# Patient Record
Sex: Female | Born: 1990 | Race: Black or African American | Hispanic: No | Marital: Married | Smoking: Never smoker
Health system: Southern US, Community
[De-identification: ages and names within clinical notes are randomized; demographics above are authoritative.]

## PROBLEM LIST (undated history)

## (undated) ENCOUNTER — Inpatient Hospital Stay (HOSPITAL_COMMUNITY): Payer: Self-pay

## (undated) DIAGNOSIS — Z789 Other specified health status: Secondary | ICD-10-CM

## (undated) HISTORY — DX: Other specified health status: Z78.9

## (undated) HISTORY — PX: NO PAST SURGERIES: SHX2092

---

## 2013-11-02 NOTE — L&D Delivery Note (Signed)
Delivery Note At 6:55 PM a viable female was delivered via Vaginal, Spontaneous Delivery (Presentation: ; Occiput Anterior).  APGAR: 8, 9; weight pending .   Placenta status: Intact, Spontaneous Pathology.  Cord: 3 vessels with the following complications: None.     Anesthesia: Epidural  Episiotomy: None Lacerations: 3rd degree Suture Repair: 3.0 vicryl Est. Blood Loss (mL): 400  Mom to postpartum.  Baby to Couplet care / Skin to Skin.  [name],[name] 10/23/2014, 7:32 PM  Dr. Adrian BlackwaterStinson for repair of 3rd degree laceration/

## 2014-08-27 ENCOUNTER — Encounter: Payer: Self-pay | Admitting: Family

## 2014-08-27 ENCOUNTER — Ambulatory Visit (INDEPENDENT_AMBULATORY_CARE_PROVIDER_SITE_OTHER): Payer: Self-pay | Admitting: Family

## 2014-08-27 VITALS — BP 102/64 | HR 80 | Temp 98.3°F | Ht 62.0 in | Wt 122.2 lb

## 2014-08-27 DIAGNOSIS — Z23 Encounter for immunization: Secondary | ICD-10-CM

## 2014-08-27 DIAGNOSIS — O0933 Supervision of pregnancy with insufficient antenatal care, third trimester: Secondary | ICD-10-CM | POA: Insufficient documentation

## 2014-08-27 DIAGNOSIS — Z3493 Encounter for supervision of normal pregnancy, unspecified, third trimester: Secondary | ICD-10-CM

## 2014-08-27 LAB — POCT URINALYSIS DIP (DEVICE)
BILIRUBIN URINE: NEGATIVE
GLUCOSE, UA: NEGATIVE mg/dL
Hgb urine dipstick: NEGATIVE
Ketones, ur: NEGATIVE mg/dL
Leukocytes, UA: NEGATIVE
Nitrite: NEGATIVE
Protein, ur: NEGATIVE mg/dL
SPECIFIC GRAVITY, URINE: 1.02 (ref 1.005–1.030)
Urobilinogen, UA: 0.2 mg/dL (ref 0.0–1.0)
pH: 7 (ref 5.0–8.0)

## 2014-08-27 NOTE — Progress Notes (Signed)
   Subjective:    Carla Haley is a G1P0 6651w6d being seen today for her first obstetrical visit.  [place]  Denies any problems during the pregnancy.  Her obstetrical history is significant for late prenatal care in [place] and recent immigration. Patient does intend to breast feed. Pregnancy history fully reviewed.  Patient reports no complaints.  Reports coughing and diarrhea two weeks ago, but no symptoms at this time.    Filed Vitals:   08/27/14 1407 08/27/14 1410  BP: 102/64   Pulse: 80   Temp: 98.3 F (36.8 C)   Height:  5\' 2"  (1.575 m)  Weight: 122 lb 3.2 oz (55.43 kg)     HISTORY: OB History  Gravida Para Term Preterm AB SAB TAB Ectopic Multiple Living  1             # Outcome Date GA Lbr Len/2nd Weight Sex Delivery Anes PTL Lv  1 CUR              Past Medical History  Diagnosis Date  . Medical history non-contributory    Past Surgical History  Procedure Laterality Date  . No past surgeries     History reviewed. No pertinent family history.   Exam   BP 102/64  Pulse 80  Temp(Src) 98.3 F (36.8 C)  Ht 5\' 2"  (1.575 m)  Wt 122 lb 3.2 oz (55.43 kg)  BMI 22.35 kg/m2  LMP 01/23/2014 Uterine Size: size equals dates  Pelvic Exam:    Perineum: No Hemorrhoids, Normal Perineum   Vulva: normal   Vagina:  normal mucosa, normal discharge, no palpable nodules   pH: Not done   Cervix: no bleeding following Pap, no cervical motion tenderness and no lesions   Adnexa: normal adnexa and no mass, fullness, tenderness   Bony Pelvis: Adequate  System: Breast:  No nipple retraction or dimpling, No nipple discharge or bleeding, No axillary or supraclavicular adenopathy, Normal to palpation without dominant masses   Skin: normal coloration and turgor, no rashes    Neurologic: negative   Extremities: normal strength, tone, and muscle mass   HEENT neck supple with midline trachea and thyroid without masses   Mouth/Teeth mucous membranes  moist, pharynx normal without lesions   Neck supple and no masses   Cardiovascular: regular rate and rhythm, no murmurs or gallops   Respiratory:  appears well, vitals normal, no respiratory distress, acyanotic, normal RR, neck free of mass or lymphadenopathy, chest clear, no wheezing, crepitations, rhonchi, normal symmetric air entry   Abdomen: soft, non-tender; bowel sounds normal; no masses,  no organomegaly   Urinary: urethral meatus normal     Assessment:    Pregnancy:   23 yo G1P0 at 2751w6d wks   Patient Active Problem List   Diagnosis Date Noted  . Late prenatal care affecting pregnancy in third trimester 08/27/2014        Plan:     Initial labs drawn.  Pap smear collected; GC/CT.  1 hr GCT.   Prenatal vitamins. Problem list reviewed and updated.  Ultrasound discussed; fetal survey: ordered.  Follow up in 2 weeks.  Marlis EdelsonKARIM, Stat[place] Markey N 08/27/2014

## 2014-08-27 NOTE — Progress Notes (Signed)
Initial prenatal Flu vaccine

## 2014-08-28 ENCOUNTER — Encounter: Payer: Self-pay | Admitting: Family

## 2014-08-28 LAB — OBSTETRIC PANEL
Antibody Screen: NEGATIVE
Basophils Absolute: 0 10*3/uL (ref 0.0–0.1)
Basophils Relative: 0 % (ref 0–1)
EOS ABS: 0.2 10*3/uL (ref 0.0–0.7)
Eosinophils Relative: 2 % (ref 0–5)
HCT: 33.8 % — ABNORMAL LOW (ref 36.0–46.0)
HEP B S AG: NEGATIVE
Hemoglobin: 11.3 g/dL — ABNORMAL LOW (ref 12.0–15.0)
LYMPHS PCT: 20 % (ref 12–46)
Lymphs Abs: 1.5 10*3/uL (ref 0.7–4.0)
MCH: 30.6 pg (ref 26.0–34.0)
MCHC: 33.4 g/dL (ref 30.0–36.0)
MCV: 91.6 fL (ref 78.0–100.0)
Monocytes Absolute: 0.6 10*3/uL (ref 0.1–1.0)
Monocytes Relative: 8 % (ref 3–12)
NEUTROS PCT: 70 % (ref 43–77)
Neutro Abs: 5.3 10*3/uL (ref 1.7–7.7)
PLATELETS: 231 10*3/uL (ref 150–400)
RBC: 3.69 MIL/uL — AB (ref 3.87–5.11)
RDW: 12.7 % (ref 11.5–15.5)
RUBELLA: 8.36 {index} — AB (ref <0.90)
Rh Type: POSITIVE
WBC: 7.6 10*3/uL (ref 4.0–10.5)

## 2014-08-28 LAB — CYTOLOGY - PAP

## 2014-08-28 LAB — HIV ANTIBODY (ROUTINE TESTING W REFLEX): HIV: NONREACTIVE

## 2014-08-28 LAB — GLUCOSE TOLERANCE, 1 HOUR (50G) W/O FASTING: Glucose, 1 Hour GTT: 98 mg/dL (ref 70–140)

## 2014-08-29 ENCOUNTER — Other Ambulatory Visit: Payer: Self-pay | Admitting: Family

## 2014-08-29 ENCOUNTER — Ambulatory Visit (HOSPITAL_COMMUNITY)
Admission: RE | Admit: 2014-08-29 | Discharge: 2014-08-29 | Disposition: A | Discharge status: Home / Self Care | Payer: Medicaid Other | Source: Ambulatory Visit | Attending: Family | Admitting: Family

## 2014-08-29 DIAGNOSIS — O0933 Supervision of pregnancy with insufficient antenatal care, third trimester: Secondary | ICD-10-CM | POA: Diagnosis not present

## 2014-08-29 DIAGNOSIS — Z3493 Encounter for supervision of normal pregnancy, unspecified, third trimester: Secondary | ICD-10-CM

## 2014-08-29 DIAGNOSIS — O403XX0 Polyhydramnios, third trimester, not applicable or unspecified: Secondary | ICD-10-CM | POA: Diagnosis not present

## 2014-08-29 DIAGNOSIS — Z3A31 31 weeks gestation of pregnancy: Secondary | ICD-10-CM | POA: Insufficient documentation

## 2014-08-29 DIAGNOSIS — Z36 Encounter for antenatal screening of mother: Secondary | ICD-10-CM | POA: Insufficient documentation

## 2014-08-29 DIAGNOSIS — Z3689 Encounter for other specified antenatal screening: Secondary | ICD-10-CM

## 2014-08-29 LAB — HEMOGLOBINOPATHY EVALUATION
HGB A: 96.7 % — AB (ref 96.8–97.8)
HGB S QUANTITAION: 0 %
Hemoglobin Other: 0 %
Hgb A2 Quant: 3 % (ref 2.2–3.2)
Hgb F Quant: 0.3 % (ref 0.0–2.0)

## 2014-08-29 LAB — PRESCRIPTION MONITORING PROFILE (19 PANEL)
AMPHETAMINE/METH: NEGATIVE ng/mL
BARBITURATE SCREEN, URINE: NEGATIVE ng/mL
BENZODIAZEPINE SCREEN, URINE: NEGATIVE ng/mL
Buprenorphine, Urine: NEGATIVE ng/mL
COCAINE METABOLITES: NEGATIVE ng/mL
Cannabinoid Scrn, Ur: NEGATIVE ng/mL
Carisoprodol, Urine: NEGATIVE ng/mL
Creatinine, Urine: 105.43 mg/dL (ref >20.0)
Fentanyl, Ur: NEGATIVE ng/mL
MDMA URINE: NEGATIVE ng/mL
Meperidine, Ur: NEGATIVE ng/mL
Methadone Screen, Urine: NEGATIVE ng/mL
Methaqualone: NEGATIVE ng/mL
Nitrites, Initial: NEGATIVE ug/mL
OPIATE SCREEN, URINE: NEGATIVE ng/mL
Oxycodone Screen, Ur: NEGATIVE ng/mL
PH URINE, INITIAL: 7.1 pH (ref 4.5–8.9)
Phencyclidine, Ur: NEGATIVE ng/mL
Propoxyphene: NEGATIVE ng/mL
TAPENTADOLUR: NEGATIVE ng/mL
TRAMADOL UR: NEGATIVE ng/mL
ZOLPIDEM, URINE: NEGATIVE ng/mL

## 2014-08-29 LAB — CULTURE, OB URINE: Colony Count: 30000

## 2014-08-31 ENCOUNTER — Other Ambulatory Visit: Payer: Self-pay | Admitting: Family

## 2014-08-31 ENCOUNTER — Encounter: Payer: Self-pay | Admitting: Family

## 2014-08-31 DIAGNOSIS — O403XX0 Polyhydramnios, third trimester, not applicable or unspecified: Secondary | ICD-10-CM | POA: Insufficient documentation

## 2014-08-31 DIAGNOSIS — O403XX1 Polyhydramnios, third trimester, fetus 1: Secondary | ICD-10-CM

## 2014-09-04 ENCOUNTER — Encounter: Payer: Self-pay | Admitting: Family

## 2014-09-05 ENCOUNTER — Telehealth: Payer: Self-pay | Admitting: *Deleted

## 2014-09-05 DIAGNOSIS — R9389 Abnormal findings on diagnostic imaging of other specified body structures: Secondary | ICD-10-CM

## 2014-09-05 NOTE — Telephone Encounter (Signed)
Appointment made for Genetic counseling on 09/06/14 @ 1400.  D. Day RNC to evaluate need of Weekly BPP/NST with provider.   Attempted to contact patient, no answer, left message with brother in law for patient to call clinic [phone]).

## 2014-09-05 NOTE — Telephone Encounter (Signed)
-----   Message from Arne ClevelandMandy J Hutchinson, [place] sent at 09/03/2014  1:03 PM EST ----- Regarding: FW: Genetic Counseling and weekly BPP This [facility] instead of the [facility] clinical pool.  Forwarding to correct pool ----- Message -----    From: Marlis EdelsonWalidah N Karim, CNM    Sent: 08/31/2014   8:42 AM      To: Marlis EdelsonWalidah N Karim, CNM, [facility] Clinical Pool Subject: Genetic Counseling and weekly BPP              Please schedule MFM consult for genetic counseling (left echogenic focus - recommended by radiologist).  Pt also needs to be scheduled for weekly BPP and NST for polyhydramnios starting next week.  I've called patient and left a message to call back.  ----- Message -----    From: Rad Results In Interface    Sent: 08/29/2014   4:53 PM      To: Marlis EdelsonWalidah N Karim, CNM

## 2014-09-05 NOTE — Telephone Encounter (Addendum)
Called pt again since she has not returned our call earlier. I left a message stating that I am calling with important information regarding appts which have been scheduled for 11/5. Please call back to nurse voice mail and state whether a detailed message can be left on her voice mail.  *Pt needs to be informed of appt @ [facility] @ 2pm, then clinic appt @ 3pm to follow.  11/5  1400  Received call from [facility] dept that pt has arrived there for appt.  They will send pt to clinic after appt.

## 2014-09-06 ENCOUNTER — Other Ambulatory Visit: Payer: Self-pay | Admitting: Obstetrics & Gynecology

## 2014-09-06 ENCOUNTER — Ambulatory Visit (HOSPITAL_COMMUNITY)
Admission: RE | Admit: 2014-09-06 | Discharge: 2014-09-06 | Disposition: A | Discharge status: Home / Self Care | Payer: Medicaid Other | Source: Ambulatory Visit | Attending: Obstetrics & Gynecology | Admitting: Obstetrics & Gynecology

## 2014-09-06 ENCOUNTER — Ambulatory Visit (INDEPENDENT_AMBULATORY_CARE_PROVIDER_SITE_OTHER): Payer: Self-pay | Admitting: Obstetrics & Gynecology

## 2014-09-06 VITALS — BP 100/62 | HR 83 | Temp 97.3°F | Wt 121.7 lb

## 2014-09-06 DIAGNOSIS — O219 Vomiting of pregnancy, unspecified: Secondary | ICD-10-CM

## 2014-09-06 DIAGNOSIS — O283 Abnormal ultrasonic finding on antenatal screening of mother: Secondary | ICD-10-CM

## 2014-09-06 DIAGNOSIS — O99613 Diseases of the digestive system complicating pregnancy, third trimester: Secondary | ICD-10-CM

## 2014-09-06 DIAGNOSIS — O401XX1 Polyhydramnios, first trimester, fetus 1: Secondary | ICD-10-CM

## 2014-09-06 DIAGNOSIS — O403XX1 Polyhydramnios, third trimester, fetus 1: Secondary | ICD-10-CM

## 2014-09-06 DIAGNOSIS — O0933 Supervision of pregnancy with insufficient antenatal care, third trimester: Secondary | ICD-10-CM

## 2014-09-06 DIAGNOSIS — K219 Gastro-esophageal reflux disease without esophagitis: Secondary | ICD-10-CM

## 2014-09-06 LAB — US OB FOLLOW UP

## 2014-09-06 LAB — POCT URINALYSIS DIP (DEVICE)
Bilirubin Urine: NEGATIVE
Glucose, UA: NEGATIVE mg/dL
HGB URINE DIPSTICK: NEGATIVE
Ketones, ur: NEGATIVE mg/dL
Leukocytes, UA: NEGATIVE
Nitrite: NEGATIVE
PH: 6.5 (ref 5.0–8.0)
Protein, ur: NEGATIVE mg/dL
SPECIFIC GRAVITY, URINE: 1.015 (ref 1.005–1.030)
UROBILINOGEN UA: 0.2 mg/dL (ref 0.0–1.0)

## 2014-09-06 MED ORDER — PROMETHAZINE HCL 25 MG PO TABS: 25.0000 mg | ORAL_TABLET | Freq: Four times a day (QID) | ORAL | Status: DC | PRN

## 2014-09-06 MED ORDER — FAMOTIDINE 20 MG PO TABS: 20.0000 mg | ORAL_TABLET | Freq: Two times a day (BID) | ORAL | Status: DC

## 2014-09-06 NOTE — Addendum Note (Signed)
Addended by: Jill SideAY, DIANE L on: 09/06/2014 04:59 PM   Modules accepted: Level of Service

## 2014-09-06 NOTE — Patient Instructions (Signed)
Return to clinic for any obstetric concerns or go to [facility] for evaluation  

## 2014-09-06 NOTE — Progress Notes (Signed)
Vomiting likely due to GERD in third trimester; prescribed Pepcid and Phernergan. Will continue monitoring for polyhydramnios.   NST performed today was reviewed and was found to be reactive.  AFI normal at 23 cm.  Continue recommended antenatal testing and prenatal care. No other complaints or concerns.  Labor and fetal movement precautions reviewed.

## 2014-09-06 NOTE — Progress Notes (Signed)
Patient reports still vomiting.  Pt had genetic counseling visit today.

## 2014-09-07 DIAGNOSIS — O283 Abnormal ultrasonic finding on antenatal screening of mother: Secondary | ICD-10-CM | POA: Insufficient documentation

## 2014-09-07 NOTE — Progress Notes (Signed)
Genetic Counseling  High-Risk Gestation Note  Appointment Date:  09/06/2014 Referred By: Lesly DukesLeggett, Kelly H, MD Date of Birth:  1991-01-10 Partner:  Jerene DillingPrince Mushunju   Pregnancy History: G1P0 Estimated Date of Delivery: 10/30/14 Estimated Gestational Age: 2546w2d Attending: Particia NearingMartha Decker, MD    Carla Haley and her partner, Mr. Jerene DillingMushunju [name], were seen for genetic counseling because of previous abnormal ultrasound findings.    Ms. Alvie HeidelbergLaurette Dobransky [facility] 08/[facility]/14.  Ultrasound revealed an echogenic intracardiac focus (EIF) and polyhydramnios.  The ultrasound report is under separate cover.    We discussed that the second trimester genetic sonogram is targeted at identifying features associated with aneuploidy.  It has evolved as a screening tool used to provide an individualized risk assessment for Down syndrome and other trisomies.  The ability of sonography to aid in the detection of aneuploidies relies on identification of both major structural anomalies and "soft markers."  The patient was counseled that the latter term refers to findings that are often normal variants and do not cause any significant medical problems.  Nonetheless, these markers have a known association with aneuploidy.    The patient was counseled that an EIF is characterized by calcified papillary muscle leading to a discreet dot in the left, or less commonly, the right ventricle.  We discussed that this finding is typically considered to be a benign variant; however, the risk of aneuploidy is increased when this marker is found in patients who have additional risk factors for fetal aneuploidy (AMA, abnormal screening test, or other markers or anomalies by fetal ultrasound).  We reviewed that Ms. Shian Plante's risk to have a fetus with Down syndrome is ~1 in 241070, based on her age of 23 y.o. at 832 weeks gestation.  Considering the combined ultrasound findings  of an EIF and pyelectasis, the adjusted risk for fetal Down syndrome is ~1 in * (*%).  Polyhydramnios is defined as an excess amount of amniotic fluid for the gestational age. We discussed that polyhydramnios is observed in 1% of pregnancies, and is especially common in pregnant women who have diabetes mellitus (DM) and in obese women. Polyhydramnios can also be due to a fetal anomaly, most commonly an obstructive abnormality such as duodenal atresia or TE fistula/esophageal atresia. Polyhydramnios can also be the result of an underlying genetic etiology. Specifically, we discussed the associated increase in risk for chromosome aberrations and single gene causes.   We reviewed chromosomes, nondisjunction, and the common features and variable prognosis of Down syndrome.  We also reviewed other aneuploidies including trisomies 13 and 4618; however, we discussed that these conditions are less likely given that the remainder of the fetal anatomy was wnl by ultrasound.  We reviewed other available screening and diagnostic options including noninvasive prenatal screening (NIPS)/cell free DNA (cfDNA) testing and amniocentesis.  She was counseled regarding the benefits and limitations of each option.  We reviewed the approximate 1 in 300-500 risk for complications for amniocentesis, including spontaneous preterm labor and delivery. After consideration of all the options, they declined additional screening or testing for fetal aneuploidy in the pregnancy, including NIPS and amniocentesis. The couple stated that they were comfortable waiting until delivery for additional testing for the baby regarding chromosome or genetic conditions, if such testing is warranted at that time.   Ms. Alvie HeidelbergLaurette Erhart was provided with written information regarding sickle cell anemia (SCA) including the carrier frequency and incidence in the African population, the availability of carrier  testing and prenatal diagnosis if indicated.  In  addition, we discussed that hemoglobinopathies are routinely screened for as part of the [place] newborn screening panel.  She previously had hemoglobin electrophoresis performed through her OB, which was within normal range.  Both family histories were reviewed and found to be noncontributory for birth defects, intellectual disabilities, and known genetic conditions. Without further information regarding the provided family history, an accurate genetic risk cannot be calculated. Further genetic counseling is warranted if more information is obtained.  Ms. Alvie HeidelbergLaurette Rautio denied exposure to environmental toxins or chemical agents. She denied the use of alcohol, tobacco or street drugs. She denied significant viral illnesses during the course of her pregnancy. Her medical and surgical histories were noncontributory.   I counseled this couple regarding the above risks and available options.  The approximate face-to-face time with the genetic counselor was 35 minutes.  Quinn PlowmanKaren Mae Cianci, MS Certified Genetic Counselor 09/07/2014

## 2014-09-10 ENCOUNTER — Ambulatory Visit (INDEPENDENT_AMBULATORY_CARE_PROVIDER_SITE_OTHER): Payer: Medicaid Other | Admitting: *Deleted

## 2014-09-10 VITALS — BP 97/62 | HR 86

## 2014-09-10 DIAGNOSIS — R300 Dysuria: Secondary | ICD-10-CM

## 2014-09-10 DIAGNOSIS — O403XX1 Polyhydramnios, third trimester, fetus 1: Secondary | ICD-10-CM

## 2014-09-10 DIAGNOSIS — O26893 Other specified pregnancy related conditions, third trimester: Secondary | ICD-10-CM

## 2014-09-10 LAB — POCT URINALYSIS DIP (DEVICE)
BILIRUBIN URINE: NEGATIVE
Glucose, UA: NEGATIVE mg/dL
HGB URINE DIPSTICK: NEGATIVE
Ketones, ur: NEGATIVE mg/dL
LEUKOCYTES UA: NEGATIVE
NITRITE: NEGATIVE
PH: 7 (ref 5.0–8.0)
PROTEIN: NEGATIVE mg/dL
Specific Gravity, Urine: 1.02 (ref 1.005–1.030)
Urobilinogen, UA: 0.2 mg/dL (ref 0.0–1.0)

## 2014-09-10 NOTE — Progress Notes (Signed)
Category 1 tracing.  Moderate variability, multiple accelerations, no decelerations.

## 2014-09-10 NOTE — Progress Notes (Signed)
Pt reports pelvic pain and painful urination.  Pelvic pain is relieved with rest.  UA done - negative for infection. Pt also reports frequent diarrhea- 4 or more times each night. Immodium OTC recommended - take sparingly to avoid constipation.

## 2014-09-11 ENCOUNTER — Encounter: Payer: Self-pay | Admitting: Obstetrics & Gynecology

## 2014-09-13 ENCOUNTER — Ambulatory Visit (INDEPENDENT_AMBULATORY_CARE_PROVIDER_SITE_OTHER): Payer: Medicaid Other | Admitting: Family Medicine

## 2014-09-13 VITALS — BP 112/70 | HR 83 | Temp 97.9°F | Wt 123.2 lb

## 2014-09-13 DIAGNOSIS — Z23 Encounter for immunization: Secondary | ICD-10-CM

## 2014-09-13 DIAGNOSIS — O403XX0 Polyhydramnios, third trimester, not applicable or unspecified: Secondary | ICD-10-CM

## 2014-09-13 DIAGNOSIS — O403XX1 Polyhydramnios, third trimester, fetus 1: Secondary | ICD-10-CM

## 2014-09-13 LAB — US OB FOLLOW UP

## 2014-09-13 LAB — POCT URINALYSIS DIP (DEVICE)
Bilirubin Urine: NEGATIVE
Glucose, UA: NEGATIVE mg/dL
Hgb urine dipstick: NEGATIVE
KETONES UR: NEGATIVE mg/dL
Leukocytes, UA: NEGATIVE
Nitrite: NEGATIVE
PH: 7 (ref 5.0–8.0)
PROTEIN: NEGATIVE mg/dL
Specific Gravity, Urine: 1.02 (ref 1.005–1.030)
UROBILINOGEN UA: 0.2 mg/dL (ref 0.0–1.0)

## 2014-09-13 MED ORDER — TETANUS-DIPHTH-ACELL PERTUSSIS 5-2.5-18.5 LF-MCG/0.5 IM SUSP
0.5000 mL | Freq: Once | INTRAMUSCULAR | Status: AC
  Administered 2014-09-13: 0.5 mL via INTRAMUSCULAR

## 2014-09-13 NOTE — Patient Instructions (Signed)
Third Trimester of Pregnancy The third trimester is from week 29 through week 42, months 7 through 9. The third trimester is a time when the fetus is growing rapidly. At the end of the ninth month, the fetus is about 20 inches in length and weighs 6-10 pounds.  BODY CHANGES Your body goes through many changes during pregnancy. The changes vary from woman to woman.   Your weight will continue to increase. You can expect to gain 25-35 pounds (11-16 kg) by the end of the pregnancy.  You may begin to get stretch marks on your hips, abdomen, and breasts.  You may urinate more often because the fetus is moving lower into your pelvis and pressing on your bladder.  You may develop or continue to have heartburn as a result of your pregnancy.  You may develop constipation because certain hormones are causing the muscles that push waste through your intestines to slow down.  You may develop hemorrhoids or swollen, bulging veins (varicose veins).  You may have pelvic pain because of the weight gain and pregnancy hormones relaxing your joints between the bones in your pelvis. Backaches may result from overexertion of the muscles supporting your posture.  You may have changes in your hair. These can include thickening of your hair, rapid growth, and changes in texture. Some women also have hair loss during or after pregnancy, or hair that feels dry or thin. Your hair will most likely return to normal after your baby is born.  Your breasts will continue to grow and be tender. A yellow discharge may leak from your breasts called colostrum.  Your belly button may stick out.  You may feel short of breath because of your expanding uterus.  You may notice the fetus "dropping," or moving lower in your abdomen.  You may have a bloody mucus discharge. This usually occurs a few days to a week before labor begins.  Your cervix becomes thin and soft (effaced) near your due date. WHAT TO EXPECT AT YOUR PRENATAL  EXAMS  You will have prenatal exams every 2 weeks until week 36. Then, you will have weekly prenatal exams. During a routine prenatal visit:  You will be weighed to make sure you and the fetus are growing normally.  Your blood pressure is taken.  Your abdomen will be measured to track your baby's growth.  The fetal heartbeat will be listened to.  Any test results from the previous visit will be discussed.  You may have a cervical check near your due date to see if you have effaced. At around 36 weeks, your caregiver will check your cervix. At the same time, your caregiver will also perform a test on the secretions of the vaginal tissue. This test is to determine if a type of bacteria, Group B streptococcus, is present. Your caregiver will explain this further. Your caregiver may ask you:  What your birth plan is.  How you are feeling.  If you are feeling the baby move.  If you have had any abnormal symptoms, such as leaking fluid, bleeding, severe headaches, or abdominal cramping.  If you have any questions. Other tests or screenings that may be performed during your third trimester include:  Blood tests that check for low iron levels (anemia).  Fetal testing to check the health, activity level, and growth of the fetus. Testing is done if you have certain medical conditions or if there are problems during the pregnancy. FALSE LABOR You may feel small, irregular contractions that   eventually go away. These are called Braxton Hicks contractions, or false labor. Contractions may last for hours, days, or even weeks before true labor sets in. If contractions come at regular intervals, intensify, or become painful, it is best to be seen by your caregiver.  SIGNS OF LABOR   Menstrual-like cramps.  Contractions that are 5 minutes apart or less.  Contractions that start on the top of the uterus and spread down to the lower abdomen and back.  A sense of increased pelvic pressure or back  pain.  A watery or bloody mucus discharge that comes from the vagina. If you have any of these signs before the 37th week of pregnancy, call your caregiver right away. You need to go to the hospital to get checked immediately. HOME CARE INSTRUCTIONS   Avoid all smoking, herbs, alcohol, and unprescribed drugs. These chemicals affect the formation and growth of the baby.  Follow your caregiver's instructions regarding medicine use. There are medicines that are either safe or unsafe to take during pregnancy.  Exercise only as directed by your caregiver. Experiencing uterine cramps is a good sign to stop exercising.  Continue to eat regular, healthy meals.  Wear a good support bra for breast tenderness.  Do not use hot tubs, steam rooms, or saunas.  Wear your seat belt at all times when driving.  Avoid raw meat, uncooked cheese, cat litter boxes, and soil used by cats. These carry germs that can cause birth defects in the baby.  Take your prenatal vitamins.  Try taking a stool softener (if your caregiver approves) if you develop constipation. Eat more high-fiber foods, such as fresh vegetables or fruit and whole grains. Drink plenty of fluids to keep your urine clear or pale yellow.  Take warm sitz baths to soothe any pain or discomfort caused by hemorrhoids. Use hemorrhoid cream if your caregiver approves.  If you develop varicose veins, wear support hose. Elevate your feet for 15 minutes, 3-4 times a day. Limit salt in your diet.  Avoid heavy lifting, wear low heal shoes, and practice good posture.  Rest a lot with your legs elevated if you have leg cramps or low back pain.  Visit your dentist if you have not gone during your pregnancy. Use a soft toothbrush to brush your teeth and be gentle when you floss.  A sexual relationship may be continued unless your caregiver directs you otherwise.  Do not travel far distances unless it is absolutely necessary and only with the approval  of your caregiver.  Take prenatal classes to understand, practice, and ask questions about the labor and delivery.  Make a trial run to the hospital.  Pack your hospital bag.  Prepare the baby's nursery.  Continue to go to all your prenatal visits as directed by your caregiver. SEEK MEDICAL CARE IF:  You are unsure if you are in labor or if your water has broken.  You have dizziness.  You have mild pelvic cramps, pelvic pressure, or nagging pain in your abdominal area.  You have persistent nausea, vomiting, or diarrhea.  You have a bad smelling vaginal discharge.  You have pain with urination. SEEK IMMEDIATE MEDICAL CARE IF:   You have a fever.  You are leaking fluid from your vagina.  You have spotting or bleeding from your vagina.  You have severe abdominal cramping or pain.  You have rapid weight loss or gain.  You have shortness of breath with chest pain.  You notice sudden or extreme swelling   of your face, hands, ankles, feet, or legs.  You have not felt your baby move in over an hour.  You have severe headaches that do not go away with medicine.  You have vision changes. Document Released: 10/13/2001 Document Revised: 10/24/2013 Document Reviewed: 12/20/2012 ExitCare Patient Information 2015 [employer]. This information is not intended to replace advice given to you by your health care provider. Make sure you discuss any questions you have with your health care provider.  

## 2014-09-13 NOTE — Progress Notes (Signed)
No concerns.  Intermittent contractions.  Good fetal movement.  No bleeding, leaking fluid.   Category 1 tracing with baseline in 130-140s.  Moderate variability, multiple accelerations, no decelerations.  Intermittent ctxs noted.

## 2014-09-17 ENCOUNTER — Ambulatory Visit (INDEPENDENT_AMBULATORY_CARE_PROVIDER_SITE_OTHER): Payer: Medicaid Other | Admitting: *Deleted

## 2014-09-17 VITALS — BP 108/62 | HR 82

## 2014-09-17 DIAGNOSIS — O99613 Diseases of the digestive system complicating pregnancy, third trimester: Secondary | ICD-10-CM

## 2014-09-17 DIAGNOSIS — O403XX1 Polyhydramnios, third trimester, fetus 1: Secondary | ICD-10-CM

## 2014-09-17 DIAGNOSIS — K219 Gastro-esophageal reflux disease without esophagitis: Secondary | ICD-10-CM

## 2014-09-17 DIAGNOSIS — O403XX0 Polyhydramnios, third trimester, not applicable or unspecified: Secondary | ICD-10-CM

## 2014-09-17 DIAGNOSIS — O219 Vomiting of pregnancy, unspecified: Secondary | ICD-10-CM

## 2014-09-17 MED ORDER — PROMETHAZINE HCL 25 MG PO TABS: 25.0000 mg | ORAL_TABLET | Freq: Four times a day (QID) | ORAL | Status: DC | PRN

## 2014-09-17 MED ORDER — FAMOTIDINE 20 MG PO TABS: 20.0000 mg | ORAL_TABLET | Freq: Two times a day (BID) | ORAL | Status: DC

## 2014-09-17 NOTE — Progress Notes (Signed)
Pt states she is vomiting after every meal and has "burning" in her throat all the time.  She has not yet obtained prescriptions for Pepcid and phenergan because she is waiting for her Medicaid card to arrive. I obtained pt's Medicaid ID # from [employer] website and wrote it down for her to take to the pharmacy so that she can obtain her prescriptions.

## 2014-09-20 ENCOUNTER — Ambulatory Visit (INDEPENDENT_AMBULATORY_CARE_PROVIDER_SITE_OTHER): Payer: Medicaid Other | Admitting: Family Medicine

## 2014-09-20 VITALS — BP 105/60 | HR 87 | Temp 97.8°F | Wt 126.4 lb

## 2014-09-20 DIAGNOSIS — O403XX1 Polyhydramnios, third trimester, fetus 1: Secondary | ICD-10-CM

## 2014-09-20 LAB — POCT URINALYSIS DIP (DEVICE)
Bilirubin Urine: NEGATIVE
GLUCOSE, UA: NEGATIVE mg/dL
Hgb urine dipstick: NEGATIVE
Ketones, ur: NEGATIVE mg/dL
Leukocytes, UA: NEGATIVE
NITRITE: NEGATIVE
PROTEIN: NEGATIVE mg/dL
SPECIFIC GRAVITY, URINE: 1.015 (ref 1.005–1.030)
UROBILINOGEN UA: 0.2 mg/dL (ref 0.0–1.0)
pH: 7.5 (ref 5.0–8.0)

## 2014-09-20 LAB — US OB FOLLOW UP

## 2014-09-20 NOTE — Progress Notes (Signed)
Doing well NST reviewed and reactive.

## 2014-09-20 NOTE — Patient Instructions (Signed)
Third Trimester of Pregnancy The third trimester is from week 29 through week 42, months 7 through 9. The third trimester is a time when the fetus is growing rapidly. At the end of the ninth month, the fetus is about 20 inches in length and weighs 6-10 pounds.  BODY CHANGES Your body goes through many changes during pregnancy. The changes vary from woman to woman.   Your weight will continue to increase. You can expect to gain 25-35 pounds (11-16 kg) by the end of the pregnancy.  You may begin to get stretch marks on your hips, abdomen, and breasts.  You may urinate more often because the fetus is moving lower into your pelvis and pressing on your bladder.  You may develop or continue to have heartburn as a result of your pregnancy.  You may develop constipation because certain hormones are causing the muscles that push waste through your intestines to slow down.  You may develop hemorrhoids or swollen, bulging veins (varicose veins).  You may have pelvic pain because of the weight gain and pregnancy hormones relaxing your joints between the bones in your pelvis. Backaches may result from overexertion of the muscles supporting your posture.  You may have changes in your hair. These can include thickening of your hair, rapid growth, and changes in texture. Some women also have hair loss during or after pregnancy, or hair that feels dry or thin. Your hair will most likely return to normal after your baby is born.  Your breasts will continue to grow and be tender. A yellow discharge may leak from your breasts called colostrum.  Your belly button may stick out.  You may feel short of breath because of your expanding uterus.  You may notice the fetus "dropping," or moving lower in your abdomen.  You may have a bloody mucus discharge. This usually occurs a few days to a week before labor begins.  Your cervix becomes thin and soft (effaced) near your due date. WHAT TO EXPECT AT YOUR  PRENATAL EXAMS  You will have prenatal exams every 2 weeks until week 36. Then, you will have weekly prenatal exams. During a routine prenatal visit:  You will be weighed to make sure you and the fetus are growing normally.  Your blood pressure is taken.  Your abdomen will be measured to track your baby's growth.  The fetal heartbeat will be listened to.  Any test results from the previous visit will be discussed.  You may have a cervical check near your due date to see if you have effaced. At around 36 weeks, your caregiver will check your cervix. At the same time, your caregiver will also perform a test on the secretions of the vaginal tissue. This test is to determine if a type of bacteria, Group B streptococcus, is present. Your caregiver will explain this further. Your caregiver may ask you:  What your birth plan is.  How you are feeling.  If you are feeling the baby move.  If you have had any abnormal symptoms, such as leaking fluid, bleeding, severe headaches, or abdominal cramping.  If you have any questions. Other tests or screenings that may be performed during your third trimester include:  Blood tests that check for low iron levels (anemia).  Fetal testing to check the health, activity level, and growth of the fetus. Testing is done if you have certain medical conditions or if there are problems during the pregnancy. FALSE LABOR You may feel small, irregular contractions that   eventually go away. These are called Braxton Hicks contractions, or false labor. Contractions may last for hours, days, or even weeks before true labor sets in. If contractions come at regular intervals, intensify, or become painful, it is best to be seen by your caregiver.  SIGNS OF LABOR   Menstrual-like cramps.  Contractions that are 5 minutes apart or less.  Contractions that start on the top of the uterus and spread down to the lower abdomen and back.  A sense of increased pelvic  pressure or back pain.  A watery or bloody mucus discharge that comes from the vagina. If you have any of these signs before the 37th week of pregnancy, call your caregiver right away. You need to go to the hospital to get checked immediately. HOME CARE INSTRUCTIONS   Avoid all smoking, herbs, alcohol, and unprescribed drugs. These chemicals affect the formation and growth of the baby.  Follow your caregiver's instructions regarding medicine use. There are medicines that are either safe or unsafe to take during pregnancy.  Exercise only as directed by your caregiver. Experiencing uterine cramps is a good sign to stop exercising.  Continue to eat regular, healthy meals.  Wear a good support bra for breast tenderness.  Do not use hot tubs, steam rooms, or saunas.  Wear your seat belt at all times when driving.  Avoid raw meat, uncooked cheese, cat litter boxes, and soil used by cats. These carry germs that can cause birth defects in the baby.  Take your prenatal vitamins.  Try taking a stool softener (if your caregiver approves) if you develop constipation. Eat more high-fiber foods, such as fresh vegetables or fruit and whole grains. Drink plenty of fluids to keep your urine clear or pale yellow.  Take warm sitz baths to soothe any pain or discomfort caused by hemorrhoids. Use hemorrhoid cream if your caregiver approves.  If you develop varicose veins, wear support hose. Elevate your feet for 15 minutes, 3-4 times a day. Limit salt in your diet.  Avoid heavy lifting, wear low heal shoes, and practice good posture.  Rest a lot with your legs elevated if you have leg cramps or low back pain.  Visit your dentist if you have not gone during your pregnancy. Use a soft toothbrush to brush your teeth and be gentle when you floss.  A sexual relationship may be continued unless your caregiver directs you otherwise.  Do not travel far distances unless it is absolutely necessary and only  with the approval of your caregiver.  Take prenatal classes to understand, practice, and ask questions about the labor and delivery.  Make a trial run to the hospital.  Pack your hospital bag.  Prepare the baby's nursery.  Continue to go to all your prenatal visits as directed by your caregiver. SEEK MEDICAL CARE IF:  You are unsure if you are in labor or if your water has broken.  You have dizziness.  You have mild pelvic cramps, pelvic pressure, or nagging pain in your abdominal area.  You have persistent nausea, vomiting, or diarrhea.  You have a bad smelling vaginal discharge.  You have pain with urination. SEEK IMMEDIATE MEDICAL CARE IF:   You have a fever.  You are leaking fluid from your vagina.  You have spotting or bleeding from your vagina.  You have severe abdominal cramping or pain.  You have rapid weight loss or gain.  You have shortness of breath with chest pain.  You notice sudden or extreme swelling   of your face, hands, ankles, feet, or legs.  You have not felt your baby move in over an hour.  You have severe headaches that do not go away with medicine.  You have vision changes. Document Released: 10/13/2001 Document Revised: 10/24/2013 Document Reviewed: 12/20/2012 [employer] Patient Information 2015 [employer]. This information is not intended to replace advice given to you by your health care provider. Make sure you discuss any questions you have with your health care provider.  Breastfeeding Deciding to breastfeed is one of the best choices you can make for you and your baby. A change in hormones during pregnancy causes your breast tissue to grow and increases the number and size of your milk ducts. These hormones also allow proteins, sugars, and fats from your blood supply to make breast milk in your milk-producing glands. Hormones prevent breast milk from being released before your baby is born as well as prompt milk flow after birth. Once  breastfeeding has begun, thoughts of your baby, as well as his or her sucking or crying, can stimulate the release of milk from your milk-producing glands.  BENEFITS OF BREASTFEEDING For Your Baby  Your first milk (colostrum) helps your baby's digestive system function better.   There are antibodies in your milk that help your baby fight off infections.   Your baby has a lower incidence of asthma, allergies, and sudden infant death syndrome.   The nutrients in breast milk are better for your baby than infant formulas and are designed uniquely for your baby's needs.   Breast milk improves your baby's brain development.   Your baby is less likely to develop other conditions, such as childhood obesity, asthma, or type 2 diabetes mellitus.  For You   Breastfeeding helps to create a very special bond between you and your baby.   Breastfeeding is convenient. Breast milk is always available at the correct temperature and costs nothing.   Breastfeeding helps to burn calories and helps you lose the weight gained during pregnancy.   Breastfeeding makes your uterus contract to its prepregnancy size faster and slows bleeding (lochia) after you give birth.   Breastfeeding helps to lower your risk of developing type 2 diabetes mellitus, osteoporosis, and breast or ovarian cancer later in life. SIGNS THAT YOUR BABY IS HUNGRY Early Signs of Hunger  Increased alertness or activity.  Stretching.  Movement of the head from side to side.  Movement of the head and opening of the mouth when the corner of the mouth or cheek is stroked (rooting).  Increased sucking sounds, smacking lips, cooing, sighing, or squeaking.  Hand-to-mouth movements.  Increased sucking of fingers or hands. Late Signs of Hunger  Fussing.  Intermittent crying. Extreme Signs of Hunger Signs of extreme hunger will require calming and consoling before your baby will be able to breastfeed successfully. Do not  wait for the following signs of extreme hunger to occur before you initiate breastfeeding:   Restlessness.  A loud, strong cry.   Screaming. BREASTFEEDING BASICS Breastfeeding Initiation  Find a comfortable place to sit or lie down, with your neck and back well supported.  Place a pillow or rolled up blanket under your baby to bring him or her to the level of your breast (if you are seated). Nursing pillows are specially designed to help support your arms and your baby while you breastfeed.  Make sure that your baby's abdomen is facing your abdomen.   Gently massage your breast. With your fingertips, massage from your chest   wall toward your nipple in a circular motion. This encourages milk flow. You may need to continue this action during the feeding if your milk flows slowly.  Support your breast with 4 fingers underneath and your thumb above your nipple. Make sure your fingers are well away from your nipple and your baby's mouth.   Stroke your baby's lips gently with your finger or nipple.   When your baby's mouth is open wide enough, quickly bring your baby to your breast, placing your entire nipple and as much of the colored area around your nipple (areola) as possible into your baby's mouth.   More areola should be visible above your baby's upper lip than below the lower lip.   Your baby's tongue should be between his or her lower gum and your breast.   Ensure that your baby's mouth is correctly positioned around your nipple (latched). Your baby's lips should create a seal on your breast and be turned out (everted).  It is common for your baby to suck about 2-3 minutes in order to start the flow of breast milk. Latching Teaching your baby how to latch on to your breast properly is very important. An improper latch can cause nipple pain and decreased milk supply for you and poor weight gain in your baby. Also, if your baby is not latched onto your nipple properly, he or she  may swallow some air during feeding. This can make your baby fussy. Burping your baby when you switch breasts during the feeding can help to get rid of the air. However, teaching your baby to latch on properly is still the best way to prevent fussiness from swallowing air while breastfeeding. Signs that your baby has successfully latched on to your nipple:    Silent tugging or silent sucking, without causing you pain.   Swallowing heard between every 3-4 sucks.    Muscle movement above and in front of his or her ears while sucking.  Signs that your baby has not successfully latched on to nipple:   Sucking sounds or smacking sounds from your baby while breastfeeding.  Nipple pain. If you think your baby has not latched on correctly, slip your finger into the corner of your baby's mouth to break the suction and place it between your baby's gums. Attempt breastfeeding initiation again. Signs of Successful Breastfeeding Signs from your baby:   A gradual decrease in the number of sucks or complete cessation of sucking.   Falling asleep.   Relaxation of his or her body.   Retention of a small amount of milk in his or her mouth.   Letting go of your breast by himself or herself. Signs from you:  Breasts that have increased in firmness, weight, and size 1-3 hours after feeding.   Breasts that are softer immediately after breastfeeding.  Increased milk volume, as well as a change in milk consistency and color by the fifth day of breastfeeding.   Nipples that are not sore, cracked, or bleeding. Signs That Your Baby is Getting Enough Milk  Wetting at least 3 diapers in a 24-hour period. The urine should be clear and pale yellow by age 5 days.  At least 3 stools in a 24-hour period by age 5 days. The stool should be soft and yellow.  At least 3 stools in a 24-hour period by age 7 days. The stool should be seedy and yellow.  No loss of weight greater than 10% of birth weight  during the first 3   days of age.  Average weight gain of 4-7 ounces (113-198 g) per week after age 4 days.  Consistent daily weight gain by age 5 days, without weight loss after the age of 2 weeks. After a feeding, your baby may spit up a small amount. This is common. BREASTFEEDING FREQUENCY AND DURATION Frequent feeding will help you make more milk and can prevent sore nipples and breast engorgement. Breastfeed when you feel the need to reduce the fullness of your breasts or when your baby shows signs of hunger. This is called "breastfeeding on demand." Avoid introducing a pacifier to your baby while you are working to establish breastfeeding (the first 4-6 weeks after your baby is born). After this time you may choose to use a pacifier. Research has shown that pacifier use during the first year of a baby's life decreases the risk of sudden infant death syndrome (SIDS). Allow your baby to feed on each breast as long as he or she wants. Breastfeed until your baby is finished feeding. When your baby unlatches or falls asleep while feeding from the first breast, offer the second breast. Because newborns are often sleepy in the first few weeks of life, you may need to awaken your baby to get him or her to feed. Breastfeeding times will vary from baby to baby. However, the following rules can serve as a guide to help you ensure that your baby is properly fed:  Newborns (babies 4 weeks of age or younger) may breastfeed every 1-3 hours.  Newborns should not go longer than 3 hours during the day or 5 hours during the night without breastfeeding.  You should breastfeed your baby a minimum of 8 times in a 24-hour period until you begin to introduce solid foods to your baby at around 6 months of age. BREAST MILK PUMPING Pumping and storing breast milk allows you to ensure that your baby is exclusively fed your breast milk, even at times when you are unable to breastfeed. This is especially important if you are  going back to work while you are still breastfeeding or when you are not able to be present during feedings. Your lactation consultant can give you guidelines on how long it is safe to store breast milk.  A breast pump is a machine that allows you to pump milk from your breast into a sterile bottle. The pumped breast milk can then be stored in a refrigerator or freezer. Some breast pumps are operated by hand, while others use electricity. Ask your lactation consultant which type will work best for you. Breast pumps can be purchased, but some hospitals and breastfeeding support groups lease breast pumps on a monthly basis. A lactation consultant can teach you how to hand express breast milk, if you prefer not to use a pump.  CARING FOR YOUR BREASTS WHILE YOU BREASTFEED Nipples can become dry, cracked, and sore while breastfeeding. The following recommendations can help keep your breasts moisturized and healthy:  Avoid using soap on your nipples.   Wear a supportive bra. Although not required, special nursing bras and tank tops are designed to allow access to your breasts for breastfeeding without taking off your entire bra or top. Avoid wearing underwire-style bras or extremely tight bras.  Air dry your nipples for 3-4minutes after each feeding.   Use only cotton bra pads to absorb leaked breast milk. Leaking of breast milk between feedings is normal.   Use lanolin on your nipples after breastfeeding. Lanolin helps to maintain your skin's   normal moisture barrier. If you use pure lanolin, you do not need to wash it off before feeding your baby again. Pure lanolin is not toxic to your baby. You may also hand express a few drops of breast milk and gently massage that milk into your nipples and allow the milk to air dry. In the first few weeks after giving birth, some women experience extremely full breasts (engorgement). Engorgement can make your breasts feel heavy, warm, and tender to the touch.  Engorgement peaks within 3-5 days after you give birth. The following recommendations can help ease engorgement:  Completely empty your breasts while breastfeeding or pumping. You may want to start by applying warm, moist heat (in the shower or with warm water-soaked hand towels) just before feeding or pumping. This increases circulation and helps the milk flow. If your baby does not completely empty your breasts while breastfeeding, pump any extra milk after he or she is finished.  Wear a snug bra (nursing or regular) or tank top for 1-2 days to signal your body to slightly decrease milk production.  Apply ice packs to your breasts, unless this is too uncomfortable for you.  Make sure that your baby is latched on and positioned properly while breastfeeding. If engorgement persists after 48 hours of following these recommendations, contact your health care provider or a lactation consultant. OVERALL HEALTH CARE RECOMMENDATIONS WHILE BREASTFEEDING  Eat healthy foods. Alternate between meals and snacks, eating 3 of each per day. Because what you eat affects your breast milk, some of the foods may make your baby more irritable than usual. Avoid eating these foods if you are sure that they are negatively affecting your baby.  Drink milk, fruit juice, and water to satisfy your thirst (about 10 glasses a day).   Rest often, relax, and continue to take your prenatal vitamins to prevent fatigue, stress, and anemia.  Continue breast self-awareness checks.  Avoid chewing and smoking tobacco.  Avoid alcohol and drug use. Some medicines that may be harmful to your baby can pass through breast milk. It is important to ask your health care provider before taking any medicine, including all over-the-counter and prescription medicine as well as vitamin and herbal supplements. It is possible to become pregnant while breastfeeding. If birth control is desired, ask your health care provider about options that  will be safe for your baby. SEEK MEDICAL CARE IF:   You feel like you want to stop breastfeeding or have become frustrated with breastfeeding.  You have painful breasts or nipples.  Your nipples are cracked or bleeding.  Your breasts are red, tender, or warm.  You have a swollen area on either breast.  You have a fever or chills.  You have nausea or vomiting.  You have drainage other than breast milk from your nipples.  Your breasts do not become full before feedings by the fifth day after you give birth.  You feel sad and depressed.  Your baby is too sleepy to eat well.  Your baby is having trouble sleeping.   Your baby is wetting less than 3 diapers in a 24-hour period.  Your baby has less than 3 stools in a 24-hour period.  Your baby's skin or the white part of his or her eyes becomes yellow.   Your baby is not gaining weight by 5 days of age. SEEK IMMEDIATE MEDICAL CARE IF:   Your baby is overly tired (lethargic) and does not want to wake up and feed.  Your baby   develops an unexplained fever. Document Released: 10/19/2005 Document Revised: 10/24/2013 Document Reviewed: 04/12/2013 [employer] Patient Information 2015 [employer]. This information is not intended to replace advice given to you by your health care provider. Make sure you discuss any questions you have with your health care provider.  

## 2014-09-24 ENCOUNTER — Inpatient Hospital Stay (HOSPITAL_COMMUNITY)
Admission: AD | Admit: 2014-09-24 | Discharge: 2014-09-24 | Disposition: A | Discharge status: Home / Self Care | Payer: Medicaid Other | Source: Ambulatory Visit | Attending: Family Medicine | Admitting: Family Medicine

## 2014-09-24 ENCOUNTER — Inpatient Hospital Stay (HOSPITAL_COMMUNITY): Payer: Medicaid Other

## 2014-09-24 ENCOUNTER — Ambulatory Visit (INDEPENDENT_AMBULATORY_CARE_PROVIDER_SITE_OTHER): Payer: Medicaid Other | Admitting: *Deleted

## 2014-09-24 VITALS — BP 106/67 | HR 88

## 2014-09-24 DIAGNOSIS — Z3A34 34 weeks gestation of pregnancy: Secondary | ICD-10-CM | POA: Diagnosis not present

## 2014-09-24 DIAGNOSIS — O403XX1 Polyhydramnios, third trimester, fetus 1: Secondary | ICD-10-CM

## 2014-09-24 DIAGNOSIS — O403XX0 Polyhydramnios, third trimester, not applicable or unspecified: Secondary | ICD-10-CM | POA: Diagnosis present

## 2014-09-24 DIAGNOSIS — O409XX0 Polyhydramnios, unspecified trimester, not applicable or unspecified: Secondary | ICD-10-CM | POA: Insufficient documentation

## 2014-09-24 DIAGNOSIS — IMO0002 Reserved for concepts with insufficient information to code with codable children: Secondary | ICD-10-CM | POA: Insufficient documentation

## 2014-09-24 MED ORDER — NIFEDIPINE 10 MG PO CAPS
10.0000 mg | ORAL_CAPSULE | Freq: Once | ORAL | Status: AC
  Administered 2014-09-24: 10 mg via ORAL
  Filled 2014-09-24: qty 1

## 2014-09-24 NOTE — Progress Notes (Signed)
O2 @ 8L/min per non-rebreather mask applied @ 1330. Pt states she has not had anything to eat or drink since last night.  Pt will remain NPO due to FHR decel. NST results called to Dr. Adrian BlackwaterStinson.  Pt to have prolonged EFM @ [facility]- transferred via w/c @ 1410.  OB US for growth and NST @ [facility] on 11/25.

## 2014-09-24 NOTE — MAU Provider Note (Signed)
Chief Complaint:  prolonged fetal monitoring    None     HPI: Carla Haley is a 23 y.o. G1P0 at 2910w6d who presents to maternity admissions from [facility] given prolonged late deceleration x 2 min while being monitored for polyhydramnios on NST.  Endorses occasional contractions, but denies leakage of fluid or vaginal bleeding. Good fetal movement.   Pregnancy Course:  Polyhydramnios LTC Fetal echogenic intracardiac focus  Past Medical History: Past Medical History  Diagnosis Date  . Medical history non-contributory     Past obstetric history: OB History  Gravida Para Term Preterm AB SAB TAB Ectopic Multiple Living  1             # Outcome Date GA Lbr Len/2nd Weight Sex Delivery Anes PTL Lv  1 Current               Past Surgical History: Past Surgical History  Procedure Laterality Date  . No past surgeries       Family History: No family history on file.  Social History: History  Substance Use Topics  . Smoking status: Never Smoker   . Smokeless tobacco: Not on file  . Alcohol Use: No    Allergies: No Known Allergies  Meds:  No prescriptions prior to admission    ROS: Pertinent findings in history of present illness.  Physical Exam  Blood pressure 107/61, pulse 88, resp. rate 16, last menstrual period 01/23/2014. GENERAL: Well-developed, well-nourished female in no acute distress.  HEENT: normocephalic HEART: normal rate RESP: normal effort ABDOMEN: Soft, non-tender, gravid appropriate for gestational age EXTREMITIES: Nontender, no edema NEURO: alert and oriented SPECULUM EXAM: NEFG, physiologic discharge, no blood, cervix clean Dilation: 1 Effacement (%): Thick Cervical Position: Posterior Exam by:: Dr Su Hiltoberts   No change with cervix 1/t/posterior after 2 hours  FHT:  Baseline 140 , moderate variability, accelerations present, no decelerations Contractions: q 3-4 mins --> q 5-7 min and less painful after procardia 10 mg x1 and PO fluids    Labs: No results found for this or any previous visit (from the past 24 hour(s)).  Imaging:  [place] Ob Detail + 14 Wk  08/29/2014   OBSTETRICAL ULTRASOUND: This exam was performed within a [place] [facility]. The OB US report was generated in the AS system, and faxed to the ordering physician.   This report is available in the [employer] [employer] PACS. See the AS Obstetric US report via the Image Link.  [place] Fetal Bpp W/o Non Stress  09/24/2014   OBSTETRICAL ULTRASOUND: This exam was performed within a [place] [facility]. The OB US report was generated in the AS system, and faxed to the ordering physician.   This report is available in the [employer] PACS. See the AS Obstetric US report via the Image Link.  [place] Fetal Bpp W/o Non Stress  08/29/2014   OBSTETRICAL ULTRASOUND: This exam was performed within a [place] [facility]. The OB US report was generated in the AS system, and faxed to the ordering physician.   This report is available in the [employer] PACS. See the AS Obstetric US report via the Image Link.  BPP 8/8 MVP 8.9 cm  MAU Course: Pt seen in MAU for prolonged deceleration on NST during regular OB appt. In MAU w/ cat I tracing with no decelerations, mod variability and + accels. BPP 8/8.  Pt was founding to be painfully contracting q 3-4 min in MAU on toco and thus SVE performed and was found to  be 1/th/high. She was given 10 mg PO procardia with reported resolution of pain with contractions and with spacing of contractions to ~ q 7 min. Repeat SVE 1/thick/high after 2 hours.  Assessment: 1. Fetal heart deceleration     Plan: Discharge home Cat I tracing with 8/8 BPP Preterm contractions - likely 2/2 known polyhydramnios. Improved with procardia 10 mg PO X 1 and PO hydration and no cervical change Not in active labor, thus stable for d/c home Labor precautions and fetal kick counts Strict RTC or MAU precautions provided with s/sx PPROM, PTL, and dec  FM    Medication List    ASK your doctor about these medications        famotidine 20 MG tablet  Commonly known as:  PEPCID  Take 1 tablet (20 mg total) by mouth 2 (two) times daily.     promethazine 25 MG tablet  Commonly known as:  PHENERGAN  Take 1 tablet (25 mg total) by mouth every 6 (six) hours as needed for nausea or vomiting.        Ethelda Chickaroline Vanice Rappa, MD 09/25/2014 5:05 AM

## 2014-09-24 NOTE — Discharge Instructions (Signed)
Fetal Movement Counts °Patient Name: __________________________________________________ Patient Due Date: ____________________ °Performing a fetal movement count is highly recommended in high-risk pregnancies, but it is good for every pregnant woman to do. Your health care provider may ask you to start counting fetal movements at 28 weeks of the pregnancy. Fetal movements often increase: °· After eating a full meal. °· After physical activity. °· After eating or drinking something sweet or cold. °· At rest. °Pay attention to when you feel the baby is most active. This will help you notice a pattern of your baby's sleep and wake cycles and what factors contribute to an increase in fetal movement. It is important to perform a fetal movement count at the same time each day when your baby is normally most active.  °HOW TO COUNT FETAL MOVEMENTS °1. Find a quiet and comfortable area to sit or lie down on your left side. Lying on your left side provides the best blood and oxygen circulation to your baby. °2. Write down the day and time on a sheet of paper or in a journal. °3. Start counting kicks, flutters, swishes, rolls, or jabs in a 2-hour period. You should feel at least 10 movements within 2 hours. °4. If you do not feel 10 movements in 2 hours, wait 2-3 hours and count again. Look for a change in the pattern or not enough counts in 2 hours. °SEEK MEDICAL CARE IF: °· You feel less than 10 counts in 2 hours, tried twice. °· There is no movement in over an hour. °· The pattern is changing or taking longer each day to reach 10 counts in 2 hours. °· You feel the baby is not moving as he or she usually does. °Date: ____________ Movements: ____________ Start time: ____________ Finish time: ____________  °Date: ____________ Movements: ____________ Start time: ____________ Finish time: ____________ °Date: ____________ Movements: ____________ Start time: ____________ Finish time: ____________ °Date: ____________ Movements:  ____________ Start time: ____________ Finish time: ____________ °Date: ____________ Movements: ____________ Start time: ____________ Finish time: ____________ °Date: ____________ Movements: ____________ Start time: ____________ Finish time: ____________ °Date: ____________ Movements: ____________ Start time: ____________ Finish time: ____________ °Date: ____________ Movements: ____________ Start time: ____________ Finish time: ____________  °Date: ____________ Movements: ____________ Start time: ____________ Finish time: ____________ °Date: ____________ Movements: ____________ Start time: ____________ Finish time: ____________ °Date: ____________ Movements: ____________ Start time: ____________ Finish time: ____________ °Date: ____________ Movements: ____________ Start time: ____________ Finish time: ____________ °Date: ____________ Movements: ____________ Start time: ____________ Finish time: ____________ °Date: ____________ Movements: ____________ Start time: ____________ Finish time: ____________ °Date: ____________ Movements: ____________ Start time: ____________ Finish time: ____________  °Date: ____________ Movements: ____________ Start time: ____________ Finish time: ____________ °Date: ____________ Movements: ____________ Start time: ____________ Finish time: ____________ °Date: ____________ Movements: ____________ Start time: ____________ Finish time: ____________ °Date: ____________ Movements: ____________ Start time: ____________ Finish time: ____________ °Date: ____________ Movements: ____________ Start time: ____________ Finish time: ____________ °Date: ____________ Movements: ____________ Start time: ____________ Finish time: ____________ °Date: ____________ Movements: ____________ Start time: ____________ Finish time: ____________  °Date: ____________ Movements: ____________ Start time: ____________ Finish time: ____________ °Date: ____________ Movements: ____________ Start time: ____________ Finish  time: ____________ °Date: ____________ Movements: ____________ Start time: ____________ Finish time: ____________ °Date: ____________ Movements: ____________ Start time: ____________ Finish time: ____________ °Date: ____________ Movements: ____________ Start time: ____________ Finish time: ____________ °Date: ____________ Movements: ____________ Start time: ____________ Finish time: ____________ °Date: ____________ Movements: ____________ Start time: ____________ Finish time: ____________  °Date: ____________ Movements: ____________ Start time: ____________ Finish   time: ____________ °Date: ____________ Movements: ____________ Start time: ____________ Finish time: ____________ °Date: ____________ Movements: ____________ Start time: ____________ Finish time: ____________ °Date: ____________ Movements: ____________ Start time: ____________ Finish time: ____________ °Date: ____________ Movements: ____________ Start time: ____________ Finish time: ____________ °Date: ____________ Movements: ____________ Start time: ____________ Finish time: ____________ °Date: ____________ Movements: ____________ Start time: ____________ Finish time: ____________  °Date: ____________ Movements: ____________ Start time: ____________ Finish time: ____________ °Date: ____________ Movements: ____________ Start time: ____________ Finish time: ____________ °Date: ____________ Movements: ____________ Start time: ____________ Finish time: ____________ °Date: ____________ Movements: ____________ Start time: ____________ Finish time: ____________ °Date: ____________ Movements: ____________ Start time: ____________ Finish time: ____________ °Date: ____________ Movements: ____________ Start time: ____________ Finish time: ____________ °Date: ____________ Movements: ____________ Start time: ____________ Finish time: ____________  °Date: ____________ Movements: ____________ Start time: ____________ Finish time: ____________ °Date: ____________  Movements: ____________ Start time: ____________ Finish time: ____________ °Date: ____________ Movements: ____________ Start time: ____________ Finish time: ____________ °Date: ____________ Movements: ____________ Start time: ____________ Finish time: ____________ °Date: ____________ Movements: ____________ Start time: ____________ Finish time: ____________ °Date: ____________ Movements: ____________ Start time: ____________ Finish time: ____________ °Date: ____________ Movements: ____________ Start time: ____________ Finish time: ____________  °Date: ____________ Movements: ____________ Start time: ____________ Finish time: ____________ °Date: ____________ Movements: ____________ Start time: ____________ Finish time: ____________ °Date: ____________ Movements: ____________ Start time: ____________ Finish time: ____________ °Date: ____________ Movements: ____________ Start time: ____________ Finish time: ____________ °Date: ____________ Movements: ____________ Start time: ____________ Finish time: ____________ °Date: ____________ Movements: ____________ Start time: ____________ Finish time: ____________ °Document Released: 11/18/2006 Document Revised: 03/05/2014 Document Reviewed: 08/15/2012 °Exit[employer]® Patient Information ©2015 [employer]. This information is not intended to replace advice given to you by your health care provider. Make sure you discuss any questions you have with your health care provider. °Third Trimester of Pregnancy °The third trimester is from week 29 through week 42, months 7 through 9. The third trimester is a time when the fetus is growing rapidly. At the end of the ninth month, the fetus is about 20 inches in length and weighs 6-10 pounds.  °BODY CHANGES °Your body goes through many changes during pregnancy. The changes vary from woman to woman.  °· Your weight will continue to increase. You can expect to gain 25-35 pounds (11-16 kg) by the end of the pregnancy. °· You may begin to get  stretch marks on your hips, abdomen, and breasts. °· You may urinate more often because the fetus is moving lower into your pelvis and pressing on your bladder. °· You may develop or continue to have heartburn as a result of your pregnancy. °· You may develop constipation because certain hormones are causing the muscles that push waste through your intestines to slow down. °· You may develop hemorrhoids or swollen, bulging veins (varicose veins). °· You may have pelvic pain because of the weight gain and pregnancy hormones relaxing your joints between the bones in your pelvis. Backaches may result from overexertion of the muscles supporting your posture. °· You may have changes in your hair. These can include thickening of your hair, rapid growth, and changes in texture. Some women also have hair loss during or after pregnancy, or hair that feels dry or thin. Your hair will most likely return to normal after your baby is born. °· Your breasts will continue to grow and be tender. A yellow discharge may leak from your breasts called colostrum. °· Your belly button may stick out. °· You may feel short of   breath because of your expanding uterus. °· You may notice the fetus "dropping," or moving lower in your abdomen. °· You may have a bloody mucus discharge. This usually occurs a few days to a week before labor begins. °· Your cervix becomes thin and soft (effaced) near your due date. °WHAT TO EXPECT AT YOUR PRENATAL EXAMS  °You will have prenatal exams every 2 weeks until week 36. Then, you will have weekly prenatal exams. During a routine prenatal visit: °5. You will be weighed to make sure you and the fetus are growing normally. °6. Your blood pressure is taken. °7. Your abdomen will be measured to track your baby's growth. °8. The fetal heartbeat will be listened to. °9. Any test results from the previous visit will be discussed. °10. You may have a cervical check near your due date to see if you have effaced. °At  around 36 weeks, your caregiver will check your cervix. At the same time, your caregiver will also perform a test on the secretions of the vaginal tissue. This test is to determine if a type of bacteria, Group B streptococcus, is present. Your caregiver will explain this further. °Your caregiver may ask you: °· What your birth plan is. °· How you are feeling. °· If you are feeling the baby move. °· If you have had any abnormal symptoms, such as leaking fluid, bleeding, severe headaches, or abdominal cramping. °· If you have any questions. °Other tests or screenings that may be performed during your third trimester include: °· Blood tests that check for low iron levels (anemia). °· Fetal testing to check the health, activity level, and growth of the fetus. Testing is done if you have certain medical conditions or if there are problems during the pregnancy. °FALSE LABOR °You may feel small, irregular contractions that eventually go away. These are called Braxton Hicks contractions, or false labor. Contractions may last for hours, days, or even weeks before true labor sets in. If contractions come at regular intervals, intensify, or become painful, it is best to be seen by your caregiver.  °SIGNS OF LABOR  °· Menstrual-like cramps. °· Contractions that are 5 minutes apart or less. °· Contractions that start on the top of the uterus and spread down to the lower abdomen and back. °· A sense of increased pelvic pressure or back pain. °· A watery or bloody mucus discharge that comes from the vagina. °If you have any of these signs before the 37th week of pregnancy, call your caregiver right away. You need to go to the hospital to get checked immediately. °HOME CARE INSTRUCTIONS  °· Avoid all smoking, herbs, alcohol, and unprescribed drugs. These chemicals affect the formation and growth of the baby. °· Follow your caregiver's instructions regarding medicine use. There are medicines that are either safe or unsafe to take  during pregnancy. °· Exercise only as directed by your caregiver. Experiencing uterine cramps is a good sign to stop exercising. °· Continue to eat regular, healthy meals. °· Wear a good support bra for breast tenderness. °· Do not use hot tubs, steam rooms, or saunas. °· Wear your seat belt at all times when driving. °· Avoid raw meat, uncooked cheese, cat litter boxes, and soil used by cats. These carry germs that can cause birth defects in the baby. °· Take your prenatal vitamins. °· Try taking a stool softener (if your caregiver approves) if you develop constipation. Eat more high-fiber foods, such as fresh vegetables or fruit and whole grains. Drink   plenty of fluids to keep your urine clear or pale yellow. °· Take warm sitz baths to soothe any pain or discomfort caused by hemorrhoids. Use hemorrhoid cream if your caregiver approves. °· If you develop varicose veins, wear support hose. Elevate your feet for 15 minutes, 3-4 times a day. Limit salt in your diet. °· Avoid heavy lifting, wear low heal shoes, and practice good posture. °· Rest a lot with your legs elevated if you have leg cramps or low back pain. °· Visit your dentist if you have not gone during your pregnancy. Use a soft toothbrush to brush your teeth and be gentle when you floss. °· A sexual relationship may be continued unless your caregiver directs you otherwise. °· Do not travel far distances unless it is absolutely necessary and only with the approval of your caregiver. °· Take prenatal classes to understand, practice, and ask questions about the labor and delivery. °· Make a trial run to the hospital. °· Pack your hospital bag. °· Prepare the baby's nursery. °· Continue to go to all your prenatal visits as directed by your caregiver. °SEEK MEDICAL CARE IF: °· You are unsure if you are in labor or if your water has broken. °· You have dizziness. °· You have mild pelvic cramps, pelvic pressure, or nagging pain in your abdominal area. °· You have  persistent nausea, vomiting, or diarrhea. °· You have a bad smelling vaginal discharge. °· You have pain with urination. °SEEK IMMEDIATE MEDICAL CARE IF:  °· You have a fever. °· You are leaking fluid from your vagina. °· You have spotting or bleeding from your vagina. °· You have severe abdominal cramping or pain. °· You have rapid weight loss or gain. °· You have shortness of breath with chest pain. °· You notice sudden or extreme swelling of your face, hands, ankles, feet, or legs. °· You have not felt your baby move in over an hour. °· You have severe headaches that do not go away with medicine. °· You have vision changes. °Document Released: 10/13/2001 Document Revised: 10/24/2013 Document Reviewed: 12/20/2012 °[employer]® Patient Information ©2015 [employer]. This information is not intended to replace advice given to you by your health care provider. Make sure you discuss any questions you have with your health care provider. ° °

## 2014-09-24 NOTE — Progress Notes (Signed)
Category 1 tracing with baseline in 140-150s.  Moderate variability, accelerations, had 2 minute decel.  Pt sent to MAU for prolonged monitoring.

## 2014-09-24 NOTE — Progress Notes (Signed)
11/16 NST reviewed and reactive

## 2014-09-24 NOTE — Progress Notes (Signed)
BPP results called to Dr Su Hiltoberts. Orders received to discharge pt home

## 2014-09-26 ENCOUNTER — Ambulatory Visit (HOSPITAL_COMMUNITY)
Admission: RE | Admit: 2014-09-26 | Discharge: 2014-09-26 | Disposition: A | Discharge status: Home / Self Care | Payer: Medicaid Other | Source: Ambulatory Visit | Attending: Obstetrics & Gynecology | Admitting: Obstetrics & Gynecology

## 2014-09-26 ENCOUNTER — Encounter (HOSPITAL_COMMUNITY): Payer: Self-pay

## 2014-09-26 DIAGNOSIS — O0930 Supervision of pregnancy with insufficient antenatal care, unspecified trimester: Secondary | ICD-10-CM | POA: Diagnosis present

## 2014-09-26 DIAGNOSIS — O409XX0 Polyhydramnios, unspecified trimester, not applicable or unspecified: Secondary | ICD-10-CM | POA: Insufficient documentation

## 2014-09-26 DIAGNOSIS — O401XX1 Polyhydramnios, first trimester, fetus 1: Secondary | ICD-10-CM

## 2014-09-26 DIAGNOSIS — O358XX0 Maternal care for other (suspected) fetal abnormality and damage, not applicable or unspecified: Secondary | ICD-10-CM | POA: Insufficient documentation

## 2014-09-26 DIAGNOSIS — O403XX0 Polyhydramnios, third trimester, not applicable or unspecified: Secondary | ICD-10-CM

## 2014-09-26 DIAGNOSIS — O283 Abnormal ultrasonic finding on antenatal screening of mother: Secondary | ICD-10-CM

## 2014-09-26 DIAGNOSIS — Z3A35 35 weeks gestation of pregnancy: Secondary | ICD-10-CM | POA: Insufficient documentation

## 2014-09-26 DIAGNOSIS — O401XX0 Polyhydramnios, first trimester, not applicable or unspecified: Secondary | ICD-10-CM | POA: Insufficient documentation

## 2014-09-28 ENCOUNTER — Ambulatory Visit (HOSPITAL_COMMUNITY): Payer: Medicaid Other

## 2014-10-01 ENCOUNTER — Ambulatory Visit (INDEPENDENT_AMBULATORY_CARE_PROVIDER_SITE_OTHER): Payer: Medicaid Other | Admitting: *Deleted

## 2014-10-01 ENCOUNTER — Other Ambulatory Visit: Payer: Self-pay | Admitting: Obstetrics & Gynecology

## 2014-10-01 VITALS — BP 98/64 | HR 100

## 2014-10-01 DIAGNOSIS — O403XX0 Polyhydramnios, third trimester, not applicable or unspecified: Secondary | ICD-10-CM

## 2014-10-01 DIAGNOSIS — O403XX1 Polyhydramnios, third trimester, fetus 1: Secondary | ICD-10-CM

## 2014-10-01 NOTE — Progress Notes (Signed)
NST reviewed and reactive.  Nabeel Gladson L. Harraway-Smith, M.D., FACOG    

## 2014-10-01 NOTE — Progress Notes (Addendum)
Polyhydramnios and 85%.  Per MFM Katherina RightDenny, needs GTT.  11/30  1635  Pt @ clinic earlier today and was informed of need for 3hr GTT.  Pt agreed to plan of care and will have 3hr GTT @ next visit on 12/3.  Diane Day RNC

## 2014-10-01 NOTE — Progress Notes (Signed)
BPP on 11/23 = 8/8, growth scan done 11/25 - EFW @ 85%, AFI - 22.67 (86%). Explained to pt and husband the need for 3hr GTT which was ordered by Dr. Penne LashLeggett on recommendation from Dr. Katherina Rightenny due to polyhydramnios. She agreed and voiced understanding - scheduled 12/3.

## 2014-10-04 ENCOUNTER — Ambulatory Visit (HOSPITAL_COMMUNITY)
Admission: RE | Admit: 2014-10-04 | Discharge: 2014-10-04 | Disposition: A | Discharge status: Home / Self Care | Payer: Medicaid Other | Source: Ambulatory Visit | Attending: Obstetrics & Gynecology | Admitting: Obstetrics & Gynecology

## 2014-10-04 ENCOUNTER — Ambulatory Visit (INDEPENDENT_AMBULATORY_CARE_PROVIDER_SITE_OTHER): Payer: Medicaid Other | Admitting: Obstetrics & Gynecology

## 2014-10-04 VITALS — BP 110/69 | HR 97 | Temp 98.3°F | Wt 131.0 lb

## 2014-10-04 DIAGNOSIS — Z3A36 36 weeks gestation of pregnancy: Secondary | ICD-10-CM | POA: Diagnosis not present

## 2014-10-04 DIAGNOSIS — O0933 Supervision of pregnancy with insufficient antenatal care, third trimester: Secondary | ICD-10-CM | POA: Diagnosis present

## 2014-10-04 DIAGNOSIS — O403XX1 Polyhydramnios, third trimester, fetus 1: Secondary | ICD-10-CM | POA: Diagnosis not present

## 2014-10-04 DIAGNOSIS — O289 Unspecified abnormal findings on antenatal screening of mother: Secondary | ICD-10-CM | POA: Insufficient documentation

## 2014-10-04 DIAGNOSIS — O358XX0 Maternal care for other (suspected) fetal abnormality and damage, not applicable or unspecified: Secondary | ICD-10-CM | POA: Insufficient documentation

## 2014-10-04 DIAGNOSIS — O403XX0 Polyhydramnios, third trimester, not applicable or unspecified: Secondary | ICD-10-CM | POA: Diagnosis present

## 2014-10-04 DIAGNOSIS — O99810 Abnormal glucose complicating pregnancy: Secondary | ICD-10-CM

## 2014-10-04 DIAGNOSIS — O288 Other abnormal findings on antenatal screening of mother: Secondary | ICD-10-CM

## 2014-10-04 LAB — POCT URINALYSIS DIP (DEVICE)
BILIRUBIN URINE: NEGATIVE
GLUCOSE, UA: NEGATIVE mg/dL
HGB URINE DIPSTICK: NEGATIVE
Ketones, ur: NEGATIVE mg/dL
NITRITE: NEGATIVE
Protein, ur: NEGATIVE mg/dL
Specific Gravity, Urine: 1.015 (ref 1.005–1.030)
UROBILINOGEN UA: 0.2 mg/dL (ref 0.0–1.0)
pH: 7 (ref 5.0–8.0)

## 2014-10-04 LAB — OB RESULTS CONSOLE GC/CHLAMYDIA
Chlamydia: NEGATIVE
Gonorrhea: NEGATIVE

## 2014-10-04 LAB — GLUCOSE, CAPILLARY: Glucose-Capillary: 111 mg/dL — ABNORMAL HIGH (ref 70–99)

## 2014-10-04 LAB — OB RESULTS CONSOLE GBS: STREP GROUP B AG: NEGATIVE

## 2014-10-04 NOTE — Progress Notes (Signed)
3 hr GTT today, will follow up results and manage accordingly. Pelvic cultures today. NRNST, sent for BPP, will follow up results and manage accordingly. No other complaints or concerns.  Labor and fetal movement precautions reviewed.

## 2014-10-04 NOTE — Progress Notes (Signed)
Patient walked upstairs to ultrasound for BPP due to NR-NST

## 2014-10-04 NOTE — Patient Instructions (Signed)
Return to clinic for any obstetric concerns or go to [facility] for evaluation  

## 2014-10-04 NOTE — Progress Notes (Signed)
Today completing 3 hr GTT. C/o dizziness, lightheaded. cbg 111 . Dizziness resolved with sittng down.States woke up at 2 am with contractions, but not as many now.

## 2014-10-05 LAB — GLUCOSE TOLERANCE, 3 HOURS
Glucose Tolerance, 1 hour: 87 mg/dL (ref 70–189)
Glucose Tolerance, 2 hour: 86 mg/dL (ref 70–164)
Glucose Tolerance, Fasting: 63 mg/dL — ABNORMAL LOW (ref 70–104)
Glucose, GTT - 3 Hour: 84 mg/dL (ref 70–144)

## 2014-10-05 LAB — GC/CHLAMYDIA PROBE AMP
CT Probe RNA: NEGATIVE
GC Probe RNA: NEGATIVE

## 2014-10-07 LAB — CULTURE, BETA STREP (GROUP B ONLY)

## 2014-10-08 ENCOUNTER — Ambulatory Visit (INDEPENDENT_AMBULATORY_CARE_PROVIDER_SITE_OTHER): Payer: Medicaid Other | Admitting: *Deleted

## 2014-10-08 VITALS — BP 101/66 | HR 111

## 2014-10-08 DIAGNOSIS — O403XX1 Polyhydramnios, third trimester, fetus 1: Secondary | ICD-10-CM

## 2014-10-08 DIAGNOSIS — O403XX0 Polyhydramnios, third trimester, not applicable or unspecified: Secondary | ICD-10-CM

## 2014-10-08 NOTE — Progress Notes (Signed)
Category 1 tracing with baseline in 140s.  Moderate variability, multiple accelerations, no decelerations.  

## 2014-10-11 ENCOUNTER — Ambulatory Visit (INDEPENDENT_AMBULATORY_CARE_PROVIDER_SITE_OTHER): Payer: Medicaid Other | Admitting: Family

## 2014-10-11 ENCOUNTER — Encounter: Payer: Self-pay | Admitting: Family

## 2014-10-11 VITALS — BP 115/67 | HR 77 | Temp 97.4°F | Wt 131.4 lb

## 2014-10-11 DIAGNOSIS — O403XX1 Polyhydramnios, third trimester, fetus 1: Secondary | ICD-10-CM

## 2014-10-11 DIAGNOSIS — O0933 Supervision of pregnancy with insufficient antenatal care, third trimester: Secondary | ICD-10-CM

## 2014-10-11 DIAGNOSIS — O403XX0 Polyhydramnios, third trimester, not applicable or unspecified: Secondary | ICD-10-CM

## 2014-10-11 LAB — POCT URINALYSIS DIP (DEVICE)
BILIRUBIN URINE: NEGATIVE
Glucose, UA: NEGATIVE mg/dL
HGB URINE DIPSTICK: NEGATIVE
Ketones, ur: NEGATIVE mg/dL
Leukocytes, UA: NEGATIVE
Nitrite: NEGATIVE
PH: 6 (ref 5.0–8.0)
Protein, ur: NEGATIVE mg/dL
Specific Gravity, Urine: 1.015 (ref 1.005–1.030)
Urobilinogen, UA: 0.2 mg/dL (ref 0.0–1.0)

## 2014-10-11 LAB — US OB FOLLOW UP

## 2014-10-11 NOTE — Progress Notes (Signed)
I examined pt and agree with documentation above and nurse midwife student plan of care.  Pt will be induced for polyhydramnios.  Cat I FHR tracing today.  Follow-up growth ultrasound on 10/18/14.

## 2014-10-11 NOTE — Patient Instructions (Signed)
Third Trimester of Pregnancy The third trimester is from week 29 through week 42, months 7 through 9. The third trimester is a time when the fetus is growing rapidly. At the end of the ninth month, the fetus is about 20 inches in length and weighs 6-10 pounds.  BODY CHANGES Your body goes through many changes during pregnancy. The changes vary from woman to woman.   Your weight will continue to increase. You can expect to gain 25-35 pounds (11-16 kg) by the end of the pregnancy.  You may begin to get stretch marks on your hips, abdomen, and breasts.  You may urinate more often because the fetus is moving lower into your pelvis and pressing on your bladder.  You may develop or continue to have heartburn as a result of your pregnancy.  You may develop constipation because certain hormones are causing the muscles that push waste through your intestines to slow down.  You may develop hemorrhoids or swollen, bulging veins (varicose veins).  You may have pelvic pain because of the weight gain and pregnancy hormones relaxing your joints between the bones in your pelvis. Backaches may result from overexertion of the muscles supporting your posture.  You may have changes in your hair. These can include thickening of your hair, rapid growth, and changes in texture. Some women also have hair loss during or after pregnancy, or hair that feels dry or thin. Your hair will most likely return to normal after your baby is born.  Your breasts will continue to grow and be tender. A yellow discharge may leak from your breasts called colostrum.  Your belly button may stick out.  You may feel short of breath because of your expanding uterus.  You may notice the fetus "dropping," or moving lower in your abdomen.  You may have a bloody mucus discharge. This usually occurs a few days to a week before labor begins.  Your cervix becomes thin and soft (effaced) near your due date. WHAT TO EXPECT AT YOUR PRENATAL  EXAMS  You will have prenatal exams every 2 weeks until week 36. Then, you will have weekly prenatal exams. During a routine prenatal visit:  You will be weighed to make sure you and the fetus are growing normally.  Your blood pressure is taken.  Your abdomen will be measured to track your baby's growth.  The fetal heartbeat will be listened to.  Any test results from the previous visit will be discussed.  You may have a cervical check near your due date to see if you have effaced. At around 36 weeks, your caregiver will check your cervix. At the same time, your caregiver will also perform a test on the secretions of the vaginal tissue. This test is to determine if a type of bacteria, Group B streptococcus, is present. Your caregiver will explain this further. Your caregiver may ask you:  What your birth plan is.  How you are feeling.  If you are feeling the baby move.  If you have had any abnormal symptoms, such as leaking fluid, bleeding, severe headaches, or abdominal cramping.  If you have any questions. Other tests or screenings that may be performed during your third trimester include:  Blood tests that check for low iron levels (anemia).  Fetal testing to check the health, activity level, and growth of the fetus. Testing is done if you have certain medical conditions or if there are problems during the pregnancy. FALSE LABOR You may feel small, irregular contractions that   eventually go away. These are called Braxton Hicks contractions, or false labor. Contractions may last for hours, days, or even weeks before true labor sets in. If contractions come at regular intervals, intensify, or become painful, it is best to be seen by your caregiver.  SIGNS OF LABOR   Menstrual-like cramps.  Contractions that are 5 minutes apart or less.  Contractions that start on the top of the uterus and spread down to the lower abdomen and back.  A sense of increased pelvic pressure or back  pain.  A watery or bloody mucus discharge that comes from the vagina. If you have any of these signs before the 37th week of pregnancy, call your caregiver right away. You need to go to the hospital to get checked immediately. HOME CARE INSTRUCTIONS   Avoid all smoking, herbs, alcohol, and unprescribed drugs. These chemicals affect the formation and growth of the baby.  Follow your caregiver's instructions regarding medicine use. There are medicines that are either safe or unsafe to take during pregnancy.  Exercise only as directed by your caregiver. Experiencing uterine cramps is a good sign to stop exercising.  Continue to eat regular, healthy meals.  Wear a good support bra for breast tenderness.  Do not use hot tubs, steam rooms, or saunas.  Wear your seat belt at all times when driving.  Avoid raw meat, uncooked cheese, cat litter boxes, and soil used by cats. These carry germs that can cause birth defects in the baby.  Take your prenatal vitamins.  Try taking a stool softener (if your caregiver approves) if you develop constipation. Eat more high-fiber foods, such as fresh vegetables or fruit and whole grains. Drink plenty of fluids to keep your urine clear or pale yellow.  Take warm sitz baths to soothe any pain or discomfort caused by hemorrhoids. Use hemorrhoid cream if your caregiver approves.  If you develop varicose veins, wear support hose. Elevate your feet for 15 minutes, 3-4 times a day. Limit salt in your diet.  Avoid heavy lifting, wear low heal shoes, and practice good posture.  Rest a lot with your legs elevated if you have leg cramps or low back pain.  Visit your dentist if you have not gone during your pregnancy. Use a soft toothbrush to brush your teeth and be gentle when you floss.  A sexual relationship may be continued unless your caregiver directs you otherwise.  Do not travel far distances unless it is absolutely necessary and only with the approval  of your caregiver.  Take prenatal classes to understand, practice, and ask questions about the labor and delivery.  Make a trial run to the hospital.  Pack your hospital bag.  Prepare the baby's nursery.  Continue to go to all your prenatal visits as directed by your caregiver. SEEK MEDICAL CARE IF:  You are unsure if you are in labor or if your water has broken.  You have dizziness.  You have mild pelvic cramps, pelvic pressure, or nagging pain in your abdominal area.  You have persistent nausea, vomiting, or diarrhea.  You have a bad smelling vaginal discharge.  You have pain with urination. SEEK IMMEDIATE MEDICAL CARE IF:   You have a fever.  You are leaking fluid from your vagina.  You have spotting or bleeding from your vagina.  You have severe abdominal cramping or pain.  You have rapid weight loss or gain.  You have shortness of breath with chest pain.  You notice sudden or extreme swelling   of your face, hands, ankles, feet, or legs.  You have not felt your baby move in over an hour.  You have severe headaches that do not go away with medicine.  You have vision changes. Document Released: 10/13/2001 Document Revised: 10/24/2013 Document Reviewed: 12/20/2012 ExitCare Patient Information 2015 [employer]. This information is not intended to replace advice given to you by your health care provider. Make sure you discuss any questions you have with your health care provider.  

## 2014-10-11 NOTE — Progress Notes (Addendum)
During visit today, pt and her husband also had questions about her pregnancy dating, stating that she was given a different date here at the clinic then [place].  Reports that [place] ultrasound [place], only "a scan".  Denies any ultrasounds received during first trimester.  Explained to pt/husband that first trimester ultrasounds are most accurate for pregnancy dating.  Also explained, that in the absence of a first trimester ultrasound, a sure LMP may be used for pregnancy dating.  Pt reports that she is sure of her last LMP of 01/23/14.  It was explained to pt and husband that this sure LMP date places her at 7163w2d gestation, and gives her the due date of October 30, 2014, which is the due date she was given here at the clinic, and is the reason her due date will remain 10/30/14.  Both pt and husband voiced understanding of current pregnancy dating calculations.  No further questions presented.

## 2014-10-11 NOTE — Progress Notes (Signed)
Doing well, no complaints offered.  Questions about induction process at 39 wks. Explained the entire induction process to pt and husband.  Denies vaginal bleeding or LOF.  Reports some occasional contractions.

## 2014-10-15 ENCOUNTER — Encounter: Payer: Self-pay | Admitting: *Deleted

## 2014-10-15 ENCOUNTER — Ambulatory Visit (INDEPENDENT_AMBULATORY_CARE_PROVIDER_SITE_OTHER): Payer: Medicaid Other | Admitting: *Deleted

## 2014-10-15 VITALS — BP 89/60 | HR 103

## 2014-10-15 DIAGNOSIS — O0933 Supervision of pregnancy with insufficient antenatal care, third trimester: Secondary | ICD-10-CM

## 2014-10-15 DIAGNOSIS — O283 Abnormal ultrasonic finding on antenatal screening of mother: Secondary | ICD-10-CM

## 2014-10-15 DIAGNOSIS — O403XX1 Polyhydramnios, third trimester, fetus 1: Secondary | ICD-10-CM

## 2014-10-15 DIAGNOSIS — O3660X0 Maternal care for excessive fetal growth, unspecified trimester, not applicable or unspecified: Secondary | ICD-10-CM

## 2014-10-15 DIAGNOSIS — Z789 Other specified health status: Secondary | ICD-10-CM

## 2014-10-15 NOTE — Progress Notes (Signed)
Category 1 tracing with baseline in 140s.  Moderate variability, multiple accelerations, no decelerations.  

## 2014-10-15 NOTE — Progress Notes (Signed)
US for growth on 12/17 - BPP added.  IOL scheduled 12/22 per guideline.

## 2014-10-18 ENCOUNTER — Ambulatory Visit (INDEPENDENT_AMBULATORY_CARE_PROVIDER_SITE_OTHER): Payer: Medicaid Other | Admitting: Obstetrics & Gynecology

## 2014-10-18 ENCOUNTER — Ambulatory Visit (HOSPITAL_COMMUNITY)
Admission: RE | Admit: 2014-10-18 | Discharge: 2014-10-18 | Disposition: A | Discharge status: Home / Self Care | Payer: Medicaid Other | Source: Ambulatory Visit | Attending: Family | Admitting: Family

## 2014-10-18 VITALS — BP 112/67 | HR 74 | Temp 97.5°F | Wt 133.4 lb

## 2014-10-18 DIAGNOSIS — O0933 Supervision of pregnancy with insufficient antenatal care, third trimester: Secondary | ICD-10-CM

## 2014-10-18 DIAGNOSIS — O403XX0 Polyhydramnios, third trimester, not applicable or unspecified: Secondary | ICD-10-CM

## 2014-10-18 DIAGNOSIS — O403XX1 Polyhydramnios, third trimester, fetus 1: Secondary | ICD-10-CM

## 2014-10-18 DIAGNOSIS — Z3A38 38 weeks gestation of pregnancy: Secondary | ICD-10-CM | POA: Insufficient documentation

## 2014-10-18 DIAGNOSIS — O3660X0 Maternal care for excessive fetal growth, unspecified trimester, not applicable or unspecified: Secondary | ICD-10-CM | POA: Insufficient documentation

## 2014-10-18 LAB — POCT URINALYSIS DIP (DEVICE)
Bilirubin Urine: NEGATIVE
Glucose, UA: NEGATIVE mg/dL
HGB URINE DIPSTICK: NEGATIVE
KETONES UR: NEGATIVE mg/dL
Nitrite: NEGATIVE
PH: 6 (ref 5.0–8.0)
Protein, ur: NEGATIVE mg/dL
SPECIFIC GRAVITY, URINE: 1.025 (ref 1.005–1.030)
Urobilinogen, UA: 0.2 mg/dL (ref 0.0–1.0)

## 2014-10-18 NOTE — Progress Notes (Signed)
BPP 6/8 NST reactive today. NST 10/22/14 and IOL 12/22

## 2014-10-18 NOTE — Patient Instructions (Signed)
Labor Induction  Labor induction is when steps are taken to cause a pregnant woman to begin the labor process. Most women go into labor on their own between 37 weeks and 42 weeks of the pregnancy. When this does not happen or when there is a medical need, methods may be used to induce labor. Labor induction causes a pregnant woman's uterus to contract. It also causes the cervix to soften (ripen), open (dilate), and thin out (efface). Usually, labor is not induced before 39 weeks of the pregnancy unless there is a problem with the baby or mother.  Before inducing labor, your health care provider will consider a number of factors, including the following:  The medical condition of you and the baby.   How many weeks along you are.   The status of the baby's lung maturity.   The condition of the cervix.   The position of the baby.  WHAT ARE THE REASONS FOR LABOR INDUCTION? Labor may be induced for the following reasons:  The health of the baby or mother is at risk.   The pregnancy is overdue by 1 week or more.   The water breaks but labor does not start on its own.   The mother has a health condition or serious illness, such as high blood pressure, infection, placental abruption, or diabetes.  The amniotic fluid amounts are low around the baby.   The baby is distressed.  Convenience or wanting the baby to be born on a certain date is not a reason for inducing labor. WHAT METHODS ARE USED FOR LABOR INDUCTION? Several methods of labor induction may be used, such as:   Prostaglandin medicine. This medicine causes the cervix to dilate and ripen. The medicine will also start contractions. It can be taken by mouth or by inserting a suppository into the vagina.   Inserting a thin tube (catheter) with a balloon on the end into the vagina to dilate the cervix. Once inserted, the balloon is expanded with water, which causes the cervix to open.   Stripping the membranes. Your health  care provider separates amniotic sac tissue from the cervix, causing the cervix to be stretched and causing the release of a hormone called progesterone. This may cause the uterus to contract. It is often done during an office visit. You will be sent home to wait for the contractions to begin. You will then come in for an induction.   Breaking the water. Your health care provider makes a hole in the amniotic sac using a small instrument. Once the amniotic sac breaks, contractions should begin. This may still take hours to see an effect.   Medicine to trigger or strengthen contractions. This medicine is given through an IV access tube inserted into a vein in your arm.  All of the methods of induction, besides stripping the membranes, will be done in the hospital. Induction is done in the hospital so that you and the baby can be carefully monitored.  HOW LONG DOES IT TAKE FOR LABOR TO BE INDUCED? Some inductions can take up to 2-3 days. Depending on the cervix, it usually takes less time. It takes longer when you are induced early in the pregnancy or if this is your first pregnancy. If a mother is still pregnant and the induction has been going on for 2-3 days, either the mother will be sent home or a cesarean delivery will be needed. WHAT ARE THE RISKS ASSOCIATED WITH LABOR INDUCTION? Some of the risks of induction   include:   Changes in fetal heart rate, such as too high, too low, or erratic.   Fetal distress.   Chance of infection for the mother and baby.   Increased chance of having a cesarean delivery.   Breaking off (abruption) of the placenta from the uterus (rare).   Uterine rupture (very rare).  When induction is needed for medical reasons, the benefits of induction may outweigh the risks. WHAT ARE SOME REASONS FOR NOT INDUCING LABOR? Labor induction should not be done if:   It is shown that your baby does not tolerate labor.   You have had previous surgeries on your  uterus, such as a myomectomy or the removal of fibroids.   Your placenta lies very low in the uterus and blocks the opening of the cervix (placenta previa).   Your baby is not in a head-down position.   The umbilical cord drops down into the birth canal in front of the baby. This could cut off the baby's blood and oxygen supply.   You have had a previous cesarean delivery.   There are unusual circumstances, such as the baby being extremely premature.  Document Released: 03/10/2007 Document Revised: 06/21/2013 Document Reviewed: 05/18/2013 Exit[employer] Patient Information 2015 [employer]. This information is not intended to replace advice given to you by your health care provider. Make sure you discuss any questions you have with your health care provider.  

## 2014-10-18 NOTE — Progress Notes (Signed)
Per MFM BPP today 6/8 , nst reactive.

## 2014-10-18 NOTE — Progress Notes (Signed)
Patient reports pelvic pain/pressure.  US for growth & BPP done today.

## 2014-10-22 ENCOUNTER — Telehealth (HOSPITAL_COMMUNITY): Payer: Self-pay | Admitting: *Deleted

## 2014-10-22 ENCOUNTER — Ambulatory Visit (INDEPENDENT_AMBULATORY_CARE_PROVIDER_SITE_OTHER): Payer: Medicaid Other | Admitting: *Deleted

## 2014-10-22 VITALS — BP 108/73 | HR 93

## 2014-10-22 DIAGNOSIS — O403XX1 Polyhydramnios, third trimester, fetus 1: Secondary | ICD-10-CM

## 2014-10-22 DIAGNOSIS — O403XX0 Polyhydramnios, third trimester, not applicable or unspecified: Secondary | ICD-10-CM

## 2014-10-22 NOTE — Progress Notes (Signed)
IOL tomorrow @ 0730 

## 2014-10-22 NOTE — Telephone Encounter (Signed)
Preadmission screen  

## 2014-10-23 ENCOUNTER — Inpatient Hospital Stay (HOSPITAL_COMMUNITY)
Admission: RE | Admit: 2014-10-23 | Discharge: 2014-10-25 | DRG: 989 | Disposition: A | Discharge status: Home / Self Care | Payer: Medicaid Other | Source: Ambulatory Visit | Attending: Family Medicine | Admitting: Family Medicine

## 2014-10-23 ENCOUNTER — Encounter (HOSPITAL_COMMUNITY): Payer: Self-pay

## 2014-10-23 ENCOUNTER — Inpatient Hospital Stay (HOSPITAL_COMMUNITY): Payer: Medicaid Other | Admitting: Anesthesiology

## 2014-10-23 VITALS — BP 103/57 | HR 79 | Temp 98.1°F | Resp 18 | Ht 62.0 in | Wt 133.4 lb

## 2014-10-23 DIAGNOSIS — Z3490 Encounter for supervision of normal pregnancy, unspecified, unspecified trimester: Secondary | ICD-10-CM

## 2014-10-23 DIAGNOSIS — Z3403 Encounter for supervision of normal first pregnancy, third trimester: Secondary | ICD-10-CM | POA: Diagnosis present

## 2014-10-23 DIAGNOSIS — O403XX1 Polyhydramnios, third trimester, fetus 1: Secondary | ICD-10-CM

## 2014-10-23 DIAGNOSIS — O0933 Supervision of pregnancy with insufficient antenatal care, third trimester: Secondary | ICD-10-CM

## 2014-10-23 DIAGNOSIS — Z3A39 39 weeks gestation of pregnancy: Secondary | ICD-10-CM

## 2014-10-23 DIAGNOSIS — O3663X0 Maternal care for excessive fetal growth, third trimester, not applicable or unspecified: Secondary | ICD-10-CM | POA: Diagnosis present

## 2014-10-23 DIAGNOSIS — O403XX0 Polyhydramnios, third trimester, not applicable or unspecified: Secondary | ICD-10-CM | POA: Diagnosis present

## 2014-10-23 DIAGNOSIS — O283 Abnormal ultrasonic finding on antenatal screening of mother: Secondary | ICD-10-CM

## 2014-10-23 LAB — TYPE AND SCREEN
ABO/RH(D): A POS
Antibody Screen: NEGATIVE

## 2014-10-23 LAB — HIV ANTIBODY (ROUTINE TESTING W REFLEX): HIV: NONREACTIVE

## 2014-10-23 LAB — CBC
HEMATOCRIT: 32.9 % — AB (ref 36.0–46.0)
HEMOGLOBIN: 10.8 g/dL — AB (ref 12.0–15.0)
MCH: 30 pg (ref 26.0–34.0)
MCHC: 32.8 g/dL (ref 30.0–36.0)
MCV: 91.4 fL (ref 78.0–100.0)
Platelets: 223 10*3/uL (ref 150–400)
RBC: 3.6 MIL/uL — AB (ref 3.87–5.11)
RDW: 12.4 % (ref 11.5–15.5)
WBC: 6.3 10*3/uL (ref 4.0–10.5)

## 2014-10-23 LAB — ABO/RH: ABO/RH(D): A POS

## 2014-10-23 LAB — RPR

## 2014-10-23 MED ORDER — DIPHENHYDRAMINE HCL 25 MG PO CAPS: 25.0000 mg | ORAL_CAPSULE | Freq: Four times a day (QID) | ORAL | Status: DC | PRN

## 2014-10-23 MED ORDER — LANOLIN HYDROUS EX OINT: TOPICAL_OINTMENT | CUTANEOUS | Status: DC | PRN

## 2014-10-23 MED ORDER — OXYCODONE-ACETAMINOPHEN 5-325 MG PO TABS: 1.0000 | ORAL_TABLET | ORAL | Status: DC | PRN

## 2014-10-23 MED ORDER — EPHEDRINE 5 MG/ML INJ: 10.0000 mg | INTRAVENOUS | Status: DC | PRN

## 2014-10-23 MED ORDER — SIMETHICONE 80 MG PO CHEW: 80.0000 mg | CHEWABLE_TABLET | ORAL | Status: DC | PRN

## 2014-10-23 MED ORDER — OXYTOCIN 40 UNITS IN LACTATED RINGERS INFUSION - SIMPLE MED
1.0000 m[IU]/min | INTRAVENOUS | Status: DC
  Administered 2014-10-23: 2 m[IU]/min via INTRAVENOUS
  Filled 2014-10-23: qty 1000

## 2014-10-23 MED ORDER — OXYTOCIN BOLUS FROM INFUSION
500.0000 mL | INTRAVENOUS | Status: DC
  Administered 2014-10-23: 500 mL via INTRAVENOUS

## 2014-10-23 MED ORDER — BUPIVACAINE HCL (PF) 0.25 % IJ SOLN
INTRAMUSCULAR | Status: DC | PRN
  Administered 2014-10-23: 5 mL

## 2014-10-23 MED ORDER — SENNOSIDES-DOCUSATE SODIUM 8.6-50 MG PO TABS
2.0000 | ORAL_TABLET | ORAL | Status: DC
  Administered 2014-10-23: 2 via ORAL
  Filled 2014-10-23: qty 2

## 2014-10-23 MED ORDER — FENTANYL 2.5 MCG/ML BUPIVACAINE 1/10 % EPIDURAL INFUSION (WH - ANES)
INTRAMUSCULAR | Status: AC
  Filled 2014-10-23: qty 125

## 2014-10-23 MED ORDER — LACTATED RINGERS IV SOLN: 500.0000 mL | Freq: Once | INTRAVENOUS | Status: DC

## 2014-10-23 MED ORDER — WITCH HAZEL-GLYCERIN EX PADS: 1.0000 "application " | MEDICATED_PAD | CUTANEOUS | Status: DC | PRN

## 2014-10-23 MED ORDER — OXYCODONE-ACETAMINOPHEN 5-325 MG PO TABS
2.0000 | ORAL_TABLET | ORAL | Status: DC | PRN
  Filled 2014-10-23: qty 2

## 2014-10-23 MED ORDER — FENTANYL 2.5 MCG/ML BUPIVACAINE 1/10 % EPIDURAL INFUSION (WH - ANES)
14.0000 mL/h | INTRAMUSCULAR | Status: DC | PRN
  Administered 2014-10-23: 14 mL/h via EPIDURAL

## 2014-10-23 MED ORDER — PHENYLEPHRINE 40 MCG/ML (10ML) SYRINGE FOR IV PUSH (FOR BLOOD PRESSURE SUPPORT)
PREFILLED_SYRINGE | INTRAVENOUS | Status: AC
  Administered 2014-10-23: 40 ug
  Filled 2014-10-23: qty 10

## 2014-10-23 MED ORDER — ONDANSETRON HCL 4 MG/2ML IJ SOLN: 4.0000 mg | INTRAMUSCULAR | Status: DC | PRN

## 2014-10-23 MED ORDER — LIDOCAINE HCL (PF) 1 % IJ SOLN
INTRAMUSCULAR | Status: DC | PRN
  Administered 2014-10-23 (×2): 4 mL

## 2014-10-23 MED ORDER — PRENATAL MULTIVITAMIN CH
1.0000 | ORAL_TABLET | Freq: Every day | ORAL | Status: DC
  Administered 2014-10-24 – 2014-10-25 (×2): 1 via ORAL
  Filled 2014-10-23 (×2): qty 1

## 2014-10-23 MED ORDER — IBUPROFEN 600 MG PO TABS
600.0000 mg | ORAL_TABLET | Freq: Four times a day (QID) | ORAL | Status: DC
  Administered 2014-10-23 – 2014-10-25 (×7): 600 mg via ORAL
  Filled 2014-10-23 (×7): qty 1

## 2014-10-23 MED ORDER — ONDANSETRON HCL 4 MG PO TABS: 4.0000 mg | ORAL_TABLET | ORAL | Status: DC | PRN

## 2014-10-23 MED ORDER — PHENYLEPHRINE 40 MCG/ML (10ML) SYRINGE FOR IV PUSH (FOR BLOOD PRESSURE SUPPORT)
80.0000 ug | PREFILLED_SYRINGE | INTRAVENOUS | Status: DC | PRN
Start: 2014-10-23 — End: 2014-10-23

## 2014-10-23 MED ORDER — PHENYLEPHRINE 40 MCG/ML (10ML) SYRINGE FOR IV PUSH (FOR BLOOD PRESSURE SUPPORT): 80.0000 ug | PREFILLED_SYRINGE | INTRAVENOUS | Status: DC | PRN

## 2014-10-23 MED ORDER — DIBUCAINE 1 % RE OINT
1.0000 "application " | TOPICAL_OINTMENT | RECTAL | Status: DC | PRN
  Filled 2014-10-23: qty 28

## 2014-10-23 MED ORDER — LACTATED RINGERS IV SOLN
INTRAVENOUS | Status: DC
  Administered 2014-10-23: 09:00:00 via INTRAVENOUS

## 2014-10-23 MED ORDER — BENZOCAINE-MENTHOL 20-0.5 % EX AERO
1.0000 "application " | INHALATION_SPRAY | CUTANEOUS | Status: DC | PRN
  Administered 2014-10-23: 1 via TOPICAL
  Filled 2014-10-23 (×2): qty 56

## 2014-10-23 MED ORDER — DIPHENHYDRAMINE HCL 50 MG/ML IJ SOLN: 12.5000 mg | INTRAMUSCULAR | Status: DC | PRN

## 2014-10-23 MED ORDER — FENTANYL 2.5 MCG/ML BUPIVACAINE 1/10 % EPIDURAL INFUSION (WH - ANES)
INTRAMUSCULAR | Status: DC | PRN
  Administered 2014-10-23: 14 mL/h via EPIDURAL

## 2014-10-23 MED ORDER — LACTATED RINGERS IV SOLN: 500.0000 mL | INTRAVENOUS | Status: DC | PRN

## 2014-10-23 MED ORDER — OXYTOCIN 40 UNITS IN LACTATED RINGERS INFUSION - SIMPLE MED
62.5000 mL/h | INTRAVENOUS | Status: DC
  Administered 2014-10-23: 62.5 mL/h via INTRAVENOUS

## 2014-10-23 MED ORDER — TETANUS-DIPHTH-ACELL PERTUSSIS 5-2.5-18.5 LF-MCG/0.5 IM SUSP
0.5000 mL | Freq: Once | INTRAMUSCULAR | Status: DC
  Filled 2014-10-23: qty 0.5

## 2014-10-23 MED ORDER — ACETAMINOPHEN 325 MG PO TABS: 650.0000 mg | ORAL_TABLET | ORAL | Status: DC | PRN

## 2014-10-23 MED ORDER — LIDOCAINE HCL (PF) 1 % IJ SOLN: 30.0000 mL | INTRAMUSCULAR | Status: DC | PRN

## 2014-10-23 MED ORDER — ZOLPIDEM TARTRATE 5 MG PO TABS: 5.0000 mg | ORAL_TABLET | Freq: Every evening | ORAL | Status: DC | PRN

## 2014-10-23 MED ORDER — CITRIC ACID-SODIUM CITRATE 334-500 MG/5ML PO SOLN
30.0000 mL | ORAL | Status: DC | PRN
Start: 2014-10-23 — End: 2014-10-23
  Administered 2014-10-23: 30 mL via ORAL
  Filled 2014-10-23: qty 15

## 2014-10-23 MED ORDER — OXYCODONE-ACETAMINOPHEN 5-325 MG PO TABS: 2.0000 | ORAL_TABLET | ORAL | Status: DC | PRN

## 2014-10-23 MED ORDER — TERBUTALINE SULFATE 1 MG/ML IJ SOLN: 0.2500 mg | Freq: Once | INTRAMUSCULAR | Status: DC | PRN

## 2014-10-23 MED ORDER — FLEET ENEMA 7-19 GM/118ML RE ENEM: 1.0000 | ENEMA | RECTAL | Status: DC | PRN

## 2014-10-23 MED ORDER — ONDANSETRON HCL 4 MG/2ML IJ SOLN
4.0000 mg | Freq: Four times a day (QID) | INTRAMUSCULAR | Status: DC | PRN
Start: 2014-10-23 — End: 2014-10-23
  Administered 2014-10-23: 4 mg via INTRAVENOUS
  Filled 2014-10-23: qty 2

## 2014-10-23 NOTE — Progress Notes (Signed)
Carla Haley is a 23 y.o. G1P0000 at 3085w0d admitted for IOL due to polyhydramnios and LGA Subjective: Comfortable.  Objective: BP 123/81 mmHg  Pulse 73  Temp(Src) 98.1 F (36.7 C) (Oral)  Resp 18  Ht 5\' 2"  (1.575 m)  Wt 60.51 kg (133 lb 6.4 oz)  BMI 24.39 kg/m2  LMP 01/23/2014 (Exact Date)  Fetal Heart FHR: 125 bpm, variability: moderate,  accelerations:  Present,  decelerations:  Absent   Contractions: q 2-3  SVE:  4.5/80/-2 applied> AROM clear fluid  Assessment / Plan:  Labor: Early labor Fetal Wellbeing: category 1 Pain Control:  Wants epidural but not yet Expected mode of delivery: NSVD  [name] 10/23/2014, 2:04 PM

## 2014-10-23 NOTE — Anesthesia Procedure Notes (Signed)
Epidural Patient location during procedure: OB Start time: 10/23/2014 3:35 PM End time: 10/23/2014 3:40 PM  Staffing Anesthesiologist: Felipe DroneJUDD, Elane Peabody JENNETTE Performed by: anesthesiologist   Preanesthetic Checklist Completed: patient identified, site marked, surgical consent, pre-op evaluation, timeout performed, IV checked, risks and benefits discussed and monitors and equipment checked  Epidural Patient position: sitting Prep: site prepped and draped and DuraPrep Patient monitoring: continuous pulse ox and blood pressure Approach: midline Location: L3-L4 Injection technique: LOR saline  Needle:  Needle type: Tuohy  Needle gauge: 17 G Needle length: 9 cm and 9 Needle insertion depth: 4.5 cm Catheter type: closed end flexible Catheter size: 19 Gauge Catheter at skin depth: 9.5 cm Test dose: negative  Assessment Sensory level: T10 Events: blood not aspirated, injection not painful, no injection resistance, negative IV test and no paresthesia

## 2014-10-23 NOTE — Progress Notes (Signed)
Patient ID: Alvie HeidelbergLaurette Brackens, female   DOB: January 07, 1991, 23 y.o.   MRN: [id number] Alvie HeidelbergLaurette Coomer is a 23 y.o. G1P0000 at 8451w0d admitted for IOL for unexplained polyhydramnios  Subjective: Comfortable with epidural. Increased pressure with contractions.   Objective: BP 114/65 mmHg  Pulse 85  Temp(Src) 98.3 F (36.8 C) (Oral)  Resp 18  Ht 5\' 2"  (1.575 m)  Wt 60.51 kg (133 lb 6.4 oz)  BMI 24.39 kg/m2  SpO2 99%  LMP 01/23/2014 (Exact Date)  Fetal Heart FHR: 150 bpm, variability: moderate,  accelerations:  Present,  decelerations:  Absent   Contractions: q2-3  SVE:   Dilation: 10 Effacement (%): 80 Station: +2 Exam by:: Susie Nix RN  Assessment / Plan:  Labor: Starting active second stage Fetal Wellbeing: Category 1 Pain Control:  adequate Expected mode of delivery: NSVD Dr. Adrian BlackwaterStinson aware of EFW  Kveon Casanas 10/23/2014, 6:34 PM

## 2014-10-23 NOTE — Anesthesia Preprocedure Evaluation (Signed)
Anesthesia Evaluation  Patient identified by MRN, date of birth, ID band Patient awake    Reviewed: Allergy & Precautions, H&P , NPO status , Patient's Chart, lab work & pertinent test results  History of Anesthesia Complications Negative for: history of anesthetic complications  Airway Mallampati: III  TM Distance: >3 FB Neck ROM: Full    Dental no notable dental hx.    Pulmonary neg pulmonary ROS,  breath sounds clear to auscultation  Pulmonary exam normal       Cardiovascular negative cardio ROS  Rhythm:Regular Rate:Normal     Neuro/Psych negative neurological ROS  negative psych ROS   GI/Hepatic negative GI ROS, Neg liver ROS,   Endo/Other  negative endocrine ROS  Renal/GU negative Renal ROS  negative genitourinary   Musculoskeletal negative musculoskeletal ROS (+)   Abdominal   Peds negative pediatric ROS (+)  Hematology negative hematology ROS (+)   Anesthesia Other Findings   Reproductive/Obstetrics (+) Pregnancy                             Anesthesia Physical Anesthesia Plan  ASA: II  Anesthesia Plan: Epidural   Post-op Pain Management:    Induction:   Airway Management Planned:   Additional Equipment:   Intra-op Plan:   Post-operative Plan:   Informed Consent: I have reviewed the patients History and Physical, chart, labs and discussed the procedure including the risks, benefits and alternatives for the proposed anesthesia with the patient or authorized representative who has indicated his/her understanding and acceptance.   Dental advisory given  Plan Discussed with: CRNA  Anesthesia Plan Comments:         Anesthesia Quick Evaluation
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
## 2014-10-23 NOTE — H&P (Signed)
Carla Haley is a 23 y.o. female G1 at 4462w0d  presenting for IOL for polyhydramnios and LGA fetus  Maternal Medical History:  Reason for admission: Nausea.    OB History    Gravida Para Term Preterm AB TAB SAB Ectopic Multiple Living   1 0 0 0 0 0 0 0 0 0      Past Medical History  Diagnosis Date  . Medical history non-contributory    Past Surgical History  Procedure Laterality Date  . No past surgeries     Family History: family history is not on file. Social History:  reports that she has never smoked. She does not have any smokeless tobacco history on file. She reports that she does not drink alcohol or use illicit drugs.   Prenatal Transfer Tool  Maternal Diabetes: No Normal 3 hr OGTT Genetic Screening: Declined too late Maternal Ultrasounds/Referrals: Abnormal:  Findings:   Isolated EIF (echogenic intracardiac focus), Other: polyhydramnios> high normal AFV, LGA >90th and AC>97th Fetal Ultrasounds or other Referrals:  None Maternal Substance Abuse:  No Significant Maternal Medications:  None Significant Maternal Lab Results:  Lab values include: Group B Strep negative Other Comments:  None  Review of Systems  Constitutional: Negative for fever.  Eyes: Negative for blurred vision.  Respiratory: Negative for cough.   Gastrointestinal: Positive for abdominal pain. Negative for nausea and vomiting.  Genitourinary: Negative for dysuria.  Neurological: Negative for dizziness and headaches.  Psychiatric/Behavioral: Negative for depression and suicidal ideas.    Dilation: 4 Effacement (%): 70 Station: -2 Exam by:: A Careers information officerDavis RN Blood pressure 110/71, pulse 93, temperature 97.9 F (36.6 C), temperature source Oral, resp. rate 18, height 5\' 2"  (1.575 m), last menstrual period 01/23/2014. Maternal Exam:  Uterine Assessment: Contraction strength is mild.  Contraction frequency is irregular.   Abdomen: Estimated fetal weight is 9#.   Fetal presentation: vertex  Introitus:  Normal vulva. Normal vagina.  Ferning test: not done.  Nitrazine test: not done. Amniotic fluid character: not assessed.  Pelvis: adequate for delivery.   Cervix: Cervix evaluated by digital exam.     Fetal Exam Fetal Monitor Review: Mode: ultrasound.   Baseline rate: 140.  Variability: moderate (6-25 bpm).   Pattern: accelerations present and no decelerations.    Fetal State Assessment: Category I - tracings are normal.     Physical Exam  Constitutional: She is oriented to person, place, and time. She appears well-developed and well-nourished.  HENT:  Head: Normocephalic.  Neck: Normal range of motion. Neck supple. No thyromegaly present.  Cardiovascular: Normal rate and regular rhythm.   Respiratory: Effort normal and breath sounds normal.  GI: There is no tenderness.  Genitourinary: Vagina normal.  Musculoskeletal: Normal range of motion.  Neurological: She is alert and oriented to person, place, and time. She has normal reflexes.  Skin: Skin is warm and dry.  Psychiatric: She has a normal mood and affect. Her behavior is normal. Thought content normal.    Filed Vitals:   10/23/14 0757 10/23/14 0942 10/23/14 1000  BP: 110/71 118/73 123/72  Pulse: 93 84 81  Temp: 97.9 F (36.6 C)    TempSrc: Oral    Resp: 18 18 18   Height: 5\' 2"  (1.575 m)     Prenatal labs: ABO, Rh: A/POS/-- (10/26 1534) Antibody: NEG (10/26 1534) Rubella: 8.36 (10/26 1534) RPR: NON REAC (10/26 1534)  HBsAg: NEGATIVE (10/26 1534)  HIV: NONREACTIVE (10/26 1534)  GBS: Negative (12/03 0000)  1 hr GCT: 98, 3 hr OGTT  [id number] EFW 9#8 5 days ago, AFI 22   Assessment/Plan: G1 at 6266w0d here for unexplained polyhydramnios LGA Favorable cx and irregular UCs> pitocin   [name],Carla Haley 10/23/2014, 10:18 AM

## 2014-10-24 LAB — CBC
HEMATOCRIT: 28.1 % — AB (ref 36.0–46.0)
HEMOGLOBIN: 9.6 g/dL — AB (ref 12.0–15.0)
MCH: 31 pg (ref 26.0–34.0)
MCHC: 34.2 g/dL (ref 30.0–36.0)
MCV: 90.6 fL (ref 78.0–100.0)
Platelets: 180 10*3/uL (ref 150–400)
RBC: 3.1 MIL/uL — ABNORMAL LOW (ref 3.87–5.11)
RDW: 12.3 % (ref 11.5–15.5)
WBC: 9.6 10*3/uL (ref 4.0–10.5)

## 2014-10-24 LAB — CCBB MATERNAL DONOR DRAW
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
## 2014-10-24 NOTE — Progress Notes (Signed)
Post Partum Day #1 Subjective: no complaints, up ad lib and tolerating PO; breastfeeding going well; contraception not discussed  Objective: Blood pressure 113/69, pulse 97, temperature 98.4 F (36.9 C), temperature source Oral, resp. rate 20, height 5\' 2"  (1.575 m), weight 60.51 kg (133 lb 6.4 oz), last menstrual period 01/23/2014, SpO2 100 %, unknown if currently breastfeeding.  Physical Exam:  General: alert, cooperative and no distress  Heart: RRR Lungs: nl effort Lochia: appropriate Uterine Fundus: firm DVT Evaluation: No evidence of DVT seen on physical exam.   Recent Labs  10/23/14 0850 10/24/14 0500  HGB 10.8* 9.6*  HCT 32.9* 28.1*    Assessment/Plan: Plan for discharge tomorrow   LOS: 1 day   Cam HaiSHAW, Aarilyn Dye CNM 10/24/2014, 9:34 AM

## 2014-10-24 NOTE — Lactation Note (Signed)
This note was copied from the chart of Carla Haley. Lactation Consultation Note  P1, Reviewed hand expression. Assisted in placing baby in fb.  Baby latched easily.  Sucks and some swallows observed. Demonstrated how to massage the breast to keep him active. Encouraged mother to breastfeed 15-30 min on one side and switch breasts. Discussed cluster feeding. Mom encouraged to feed baby 8-12 times/24 hours and with feeding cues.  Mom made aware of O/P services, breastfeeding support groups, community resources, and our phone # for post-discharge questions.    Patient Name: Carla Alvie HeidelbergLaurette Haley PIRJJ'OToday's Date: 10/24/2014 Reason for consult: Initial assessment   Maternal Data Has patient been taught Hand Expression?: Yes Does the patient have breastfeeding experience prior to this delivery?: No  Feeding Feeding Type: Breast Fed  LATCH Score/Interventions Latch: Grasps breast easily, tongue down, lips flanged, rhythmical sucking. Intervention(s): Breast massage;Assist with latch;Adjust position  Audible Swallowing: A few with stimulation Intervention(s): Alternate breast massage  Type of Nipple: Everted at rest and after stimulation  Comfort (Breast/Nipple): Soft / non-tender     Hold (Positioning): Assistance needed to correctly position infant at breast and maintain latch.  LATCH Score: 8  Lactation Tools Discussed/Used     Consult Status Consult Status: Follow-up Date: 10/25/14 Follow-up type: In-patient    Carla Haley, Carla Haley [facility] 10/24/2014, 2:43 PM

## 2014-10-24 NOTE — Progress Notes (Signed)
Case Manager noted CM consult that had been cancelled.  Spoke w/ pt's Nurse and she is not aware of any home medical needs.  Nurse has contacted SW regarding living situation.  Nurse will notify CM if needs arise.

## 2014-10-24 NOTE — Progress Notes (Signed)
UR chart review completed.  

## 2014-10-24 NOTE — Anesthesia Postprocedure Evaluation (Signed)
Anesthesia Post Note  Patient: Carla Haley  Procedure(s) Performed: * No procedures listed *  Anesthesia type: Epidural  Patient location: Mother/Baby  Post pain: Pain level controlled  Post assessment: Post-op Vital signs reviewed  Last Vitals:  Filed Vitals:   10/24/14 0218  BP: 113/69  Pulse: 97  Temp: 36.9 C  Resp: 20    Post vital signs: Reviewed  Level of consciousness: awake  Complications: No apparent anesthesia complications

## 2014-10-25 MED ORDER — IBUPROFEN 600 MG PO TABS: 600.0000 mg | ORAL_TABLET | Freq: Four times a day (QID) | ORAL | Status: DC

## 2014-10-25 MED ORDER — OXYCODONE-ACETAMINOPHEN 5-325 MG PO TABS: 1.0000 | ORAL_TABLET | ORAL | Status: DC | PRN

## 2014-10-25 NOTE — Discharge Summary (Signed)
Obstetric Discharge Summary Reason for Admission: induction of labor Prenatal Procedures: NST Intrapartum Procedures: spontaneous vaginal delivery Postpartum Procedures: none Complications-Operative and Postpartum: 3rd degree perineal laceration HEMOGLOBIN  Date Value Ref Range Status  10/24/2014 9.6* 12.0 - 15.0 g/dL Final   HCT  Date Value Ref Range Status  10/24/2014 28.1* 36.0 - 46.0 % Final  Hospital Course:  Expand All Collapse All   Carla Haley is a 23 y.o. female G1 at 3761w0d presenting for IOL for polyhydramnios and LGA fetus       Expand All Collapse All   Delivery Note At 6:55 PM a viable female was delivered via Vaginal, Spontaneous Delivery (Presentation: ; Occiput Anterior). APGAR: 8, 9; weight pending .  Placenta status: Intact, Spontaneous Pathology. Cord: 3 vessels with the following complications: None.    Anesthesia: Epidural  Episiotomy: None Lacerations: 3rd degree Suture Repair: 3.0 vicryl Est. Blood Loss (mL): 400  Mom to postpartum. Baby to Couplet care / Skin to Skin.  [name],[name] 10/23/2014, 7:32 PM  Dr. Adrian BlackwaterStinson for repair of 3rd degree laceration     Has done well postpartum but is sore at perineum. Will give Rx Percocet.    Physical Exam:  General: alert, cooperative and no distress Lochia: appropriate Uterine Fundus: firm Incision: healing well, no significant drainage DVT Evaluation: No evidence of DVT seen on physical exam.  Discharge Diagnoses: Term Pregnancy-delivered  Discharge Information: Date: 10/25/2014 Activity: unrestricted and pelvic rest Diet: routine Medications: PNV, Ibuprofen and Percocet Condition: stable and improved Instructions: refer to practice specific booklet Discharge to: home   Newborn Data: Live born female  Birth Weight: 8 lb 7.5 oz (3840 g) APGAR: 8, 9  Home with mother.  [facility] LLCWILLIAMS,Carla Banales 10/25/2014, 7:24 AM

## 2014-10-25 NOTE — Progress Notes (Signed)
   10/25/14 0900  Clinical Encounter Type  Visited With Patient and family together (husband Criss Alvinerince, his cousin Corene CorneaSandrine, baby Prophet)  Visit Type Follow-up;Spiritual support;Social support  Spiritual Encounters  Spiritual Needs Emotional;Prayer   Family was so grateful for initial and follow-up visits.  Provided prayer of blessing for baby and family, as well as pastoral listening, reflection, and encouragement. Family is full of joy and gratitude. Mindful of family's limited resources, traumatic history, and major life changes, brought family a prayer shawl as a tangible reminder and celebration of God's uniting presence and provision.  Family verbalized deep appreciation, has my card, and plans to be in touch for follow-up support.    [address] StreetChaplain Roy Tokarz [place], [place] [phone]

## 2014-10-25 NOTE — Lactation Note (Signed)
This note was copied from the chart of Boy Gianna Schlup. Lactation Consultation Note  Patient Name: Boy Alvie HeidelbergLaurette Reifsteck ZOXWR'UToday's Date: 10/25/2014 Reason for consult: Follow-up assessment  Baby is 40 hours old , 4 % weight loss, several voids and stools. Bili check low .  Per mom nipples tender , LC assessed breast with moms permission and reviewed hand expressing  Large drops of colostrum noted , encouraged mom to use on nipples liberally, and instructed on comfort gels, hand pump , #24 Flange  A good fit. Sore nipple and engorgement prevention and tx reviewed , also referring the Baby and me booklet. Pages 24 -25 . @ consult baby awoke , assisted mom with latch - latch score =8, multiply swallows noted , increased with compressions. Mother informed of post-discharge support and given phone number to the lactation department, including services for phone call assistance; out-patient appointments; and breastfeeding support group. List of other breastfeeding resources in the community given in the handout. Encouraged mother to call for problems or concerns related to breastfeeding.   Maternal Data Has patient been taught Hand Expression?: Yes  Feeding Feeding Type: Breast Fed Length of feed:  (still feeding at 1110, mutliply swallows noted , increased with breast compressions )  LATCH Score/Interventions Latch: Grasps breast easily, tongue down, lips flanged, rhythmical sucking. Intervention(s): Adjust position;Assist with latch;Breast massage;Breast compression  Audible Swallowing: Spontaneous and intermittent  Type of Nipple: Everted at rest and after stimulation  Comfort (Breast/Nipple): Filling, red/small blisters or bruises, mild/mod discomfort  Problem noted: Filling  Hold (Positioning): Assistance needed to correctly position infant at breast and maintain latch. Intervention(s): Breastfeeding basics reviewed;Support Pillows;Position options;Skin to skin  LATCH Score:  8  Lactation Tools Discussed/Used Tools: Comfort gels;Pump Breast pump type: Manual Pump Review: Setup, frequency, and cleaning;Milk Storage Initiated by:: MAI  Date initiated:: 10/25/14   Consult Status Consult Status: Complete Date: 10/25/14 Follow-up type: In-patient    Kathrin Greathouseorio, Murdock Jellison Ann 10/25/2014, 11:24 AM

## 2014-10-25 NOTE — Discharge Instructions (Signed)
Vaginal Laceration °A vaginal laceration is a tear in the vaginal wall. A vaginal tear falls into one of three categories:  °1. Obstetrically related tears that occur at the time of childbirth. °2. Trauma-related tears (most often related to sexual intercourse). °3. Spontaneous tears. °Vaginal tears can cause heavy bleeding (hemorrhaging) depending on severity of the tear. Tears can be intensely tender and interfere with normal activities of living. They can make sexual intercourse painful and bring on significant burning with urination. If you have a vaginal laceration, the area around your vagina may be painful when you touch or wipe it. Even light pressure from clothing may cause some pain. Vaginal tear need to be evaluated by your caregiver.   °CAUSES  °· Obstetric-related causes, such as childbirth. °· Trauma that may result from an accident during an activity, such as sexual intercourse or a bicycle ride. °· Spontaneous causes related to aging, failed healing of a past obstetric tear, chronic irritation, or skin changes that are not well understood. °SYMPTOMS  °· Slight to heavy vaginal bleeding. °· Vaginal swelling. °· Mild to severe pain. °· Vaginal tenderness. °DIAGNOSIS  °If the tear happened during childbirth, your caregiver can diagnose the tear at that time. To diagnose a vaginal tear that happened spontaneously or because of trauma, your caregiver will perform a physical exam. During the physical exam, your caregiver may also look for any signs of trouble that may need further testing. If there is hemorrhaging, your caregiver may suggest blood tests to determine the extent of bleeding. Imaging tests may be performed, such as an ultrasonography or computed tomography (CT), to look for internal damage. A biopsy may be need if there are signs of a more serious problem.  °TREATMENT  °Treatment depends on the severity of the tear. For minor tears that heal on their own, treatment may only consist of keeping  the area clean and dry. Some tears need to be repaired with stitches. Other tears may heal on their own with help from various remedies, such as antibiotic ointments, medicated creams, or petroleum products. Depending on the circumstances, oral hormones may also be suggested. Hormone remedies may also be in the form of topical creams and vaginal tablets. For more concerning situations, hospitalization and surgical repair of the tear may be needed. °HOME CARE INSTRUCTIONS  °· Take warm-water baths that cover your hips and buttocks (sitz bath) 2 to 3 times a day. This may help any discomfort and swelling.   °· Only take over-the-counter or prescription medicines for pain, discomfort, or fever as directed by your caregiver. Do not use aspirin because it can cause increased bleeding.   °· Do not douche, use tampons, or have intercourse until your caregiver says it is okay.    °· A bandage (dressing) may have been applied. Change the dressing once a day or as directed. If the dressing sticks, soak it off with warm, soapy water.   °· Apply ice or witch hazel pads to the vagina to lessen any pain or discomfort.   °· Take a stool softener or follow a special diet as directed by your caregiver. This will help ease discomfort associated with bowel movements.   °SEEK IMMEDIATE MEDICAL CARE IF:  °· You have redness or swelling in the vaginal area.   °· You have increasing, sharp, or intense pain or tenderness in the vaginal area. °· You have pus or unusual discharge coming from the tear or vagina.   °· You notice a bad smell coming from the vagina.   °· Your tear breaks open after   it healed or was repaired.   You feel lightheaded.  You have increasing abdominal pain.   You have an increasing or heavy amount of vaginal bleeding.   You have pain with intercourse after the tear heals.  MAKE SURE YOU:  Understand these instructions.  Will watch your condition.  Will get help right away if you are not doing well  or get worse. Document Released: 10/19/2005 Document Revised: 07/13/2012 Document Reviewed: 03/07/2012 [employer] Patient Information 2015 [place], [place]. This information is not intended to replace advice given to you by your health care provider. Make sure you discuss any questions you have with your health care provider.   Postpartum Care After Vaginal Delivery After you deliver your newborn (postpartum period), the usual stay in the hospital is 24-72 hours. If there were problems with your labor or delivery, or if you have other medical problems, you might be in the hospital longer.  While you are in the hospital, you will receive help and instructions on how to care for yourself and your newborn during the postpartum period.  While you are in the hospital: 4. Be sure to tell your nurses if you have pain or discomfort, as well as where you feel the pain and what makes the pain worse. 5. If you had an incision made near your vagina (episiotomy) or if you had some tearing during delivery, the nurses may put ice packs on your episiotomy or tear. The ice packs may help to reduce the pain and swelling. 6. If you are breastfeeding, you may feel uncomfortable contractions of your uterus for a couple of weeks. This is normal. The contractions help your uterus get back to normal size. 7. It is normal to have some bleeding after delivery. 1. For the first 1-3 days after delivery, the flow is red and the amount may be similar to a period. 2. It is common for the flow to start and stop. 3. In the first few days, you may pass some small clots. Let your nurses know if you begin to pass large clots or your flow increases. 4. Do not  flush blood clots down the toilet before having the nurse look at them. 5. During the next 3-10 days after delivery, your flow should become more watery and pink or brown-tinged in color. 6. Ten to fourteen days after delivery, your flow should be a small amount of yellowish-white  discharge. 7. The amount of your flow will decrease over the first few weeks after delivery. Your flow may stop in 6-8 weeks. Most women have had their flow stop by 12 weeks after delivery. 8. You should change your sanitary pads frequently. 9. Wash your hands thoroughly with soap and water for at least 20 seconds after changing pads, using the toilet, or before holding or feeding your newborn. 10. You should feel like you need to empty your bladder within the first 6-8 hours after delivery. 11. In case you become weak, lightheaded, or faint, call your nurse before you get out of bed for the first time and before you take a shower for the first time. 12. Within the first few days after delivery, your breasts may begin to feel tender and full. This is called engorgement. Breast tenderness usually goes away within 48-72 hours after engorgement occurs. You may also notice milk leaking from your breasts. If you are not breastfeeding, do not stimulate your breasts. Breast stimulation can make your breasts produce more milk. 13. Spending as much time as possible  with your newborn is very important. During this time, you and your newborn can feel close and get to know each other. Having your newborn stay in your room (rooming in) will help to strengthen the bond with your newborn. It will give you time to get to know your newborn and become comfortable caring for your newborn. 14. Your hormones change after delivery. Sometimes the hormone changes can temporarily cause you to feel sad or tearful. These feelings should not last more than a few days. If these feelings last longer than that, you should talk to your caregiver. 15. If desired, talk to your caregiver about methods of family planning or contraception. 16. Talk to your caregiver about immunizations. Your caregiver may want you to have the following immunizations before leaving the hospital: 1. Tetanus, diphtheria, and pertussis (Tdap) or tetanus and  diphtheria (Td) immunization. It is very important that you and your family (including grandparents) or others caring for your newborn are up-to-date with the Tdap or Td immunizations. The Tdap or Td immunization can help protect your newborn from getting ill. 2. Rubella immunization. 3. Varicella (chickenpox) immunization. 4. Influenza immunization. You should receive this annual immunization if you did not receive the immunization during your pregnancy. Document Released: 08/16/2007 Document Revised: 07/13/2012 Document Reviewed: 06/15/2012 [facility] [employer] Patient Information 2015 [place], [place]. This information is not intended to replace advice given to you by your health care provider. Make sure you discuss any questions you have with your health care provider.

## 2014-10-29 NOTE — Progress Notes (Signed)
NST reactive on 10/22/14 

## 2014-11-26 ENCOUNTER — Encounter: Payer: Self-pay | Admitting: Family Medicine

## 2014-11-26 ENCOUNTER — Ambulatory Visit (INDEPENDENT_AMBULATORY_CARE_PROVIDER_SITE_OTHER): Payer: Medicaid Other | Admitting: Family Medicine

## 2014-11-26 NOTE — Patient Instructions (Signed)
Postpartum Depression and Baby Blues The postpartum period begins right after the birth of a baby. During this time, there is often a great amount of joy and excitement. It is also a time of many changes in the life of the parents. Regardless of how many times a mother gives birth, each child brings new challenges and dynamics to the family. It is not unusual to have feelings of excitement along with confusing shifts in moods, emotions, and thoughts. All mothers are at risk of developing postpartum depression or the "baby blues." These mood changes can occur right after giving birth, or they may occur many months after giving birth. The baby blues or postpartum depression can be mild or severe. Additionally, postpartum depression can go away rather quickly, or it can be a long-term condition.  CAUSES Raised hormone levels and the rapid drop in those levels are thought to be a main cause of postpartum depression and the baby blues. A number of hormones change during and after pregnancy. Estrogen and progesterone usually decrease right after the delivery of your baby. The levels of thyroid hormone and various cortisol steroids also rapidly drop. Other factors that play a role in these mood changes include major life events and genetics.  RISK FACTORS If you have any of the following risks for the baby blues or postpartum depression, know what symptoms to watch out for during the postpartum period. Risk factors that may increase the likelihood of getting the baby blues or postpartum depression include:  Having a personal or family history of depression.   Having depression while being pregnant.   Having premenstrual mood issues or mood issues related to oral contraceptives.  Having a lot of life stress.   Having marital conflict.   Lacking a social support network.   Having a baby with special needs.   Having health problems, such as diabetes.  SIGNS AND SYMPTOMS Symptoms of baby blues  include:  Brief changes in mood, such as going from extreme happiness to sadness.  Decreased concentration.   Difficulty sleeping.   Crying spells, tearfulness.   Irritability.   Anxiety.  Symptoms of postpartum depression typically begin within the first month after giving birth. These symptoms include:  Difficulty sleeping or excessive sleepiness.   Marked weight loss.   Agitation.   Feelings of worthlessness.   Lack of interest in activity or food.  Postpartum psychosis is a very serious condition and can be dangerous. Fortunately, it is rare. Displaying any of the following symptoms is cause for immediate medical attention. Symptoms of postpartum psychosis include:   Hallucinations and delusions.   Bizarre or disorganized behavior.   Confusion or disorientation.  DIAGNOSIS  A diagnosis is made by an evaluation of your symptoms. There are no medical or lab tests that lead to a diagnosis, but there are various questionnaires that a health care provider may use to identify those with the baby blues, postpartum depression, or psychosis. Often, a screening tool called the Edinburgh Postnatal Depression Scale is used to diagnose depression in the postpartum period.  TREATMENT The baby blues usually goes away on its own in 1-2 weeks. Social support is often all that is needed. You will be encouraged to get adequate sleep and rest. Occasionally, you may be given medicines to help you sleep.  Postpartum depression requires treatment because it can last several months or longer if it is not treated. Treatment may include individual or group therapy, medicine, or both to address any social, physiological, and psychological   factors that may play a role in the depression. Regular exercise, a healthy diet, rest, and social support may also be strongly recommended.  Postpartum psychosis is more serious and needs treatment right away. Hospitalization is often needed. HOME CARE  INSTRUCTIONS  Get as much rest as you can. Nap when the baby sleeps.   Exercise regularly. Some women find yoga and walking to be beneficial.   Eat a balanced and nourishing diet.   Do little things that you enjoy. Have a cup of tea, take a bubble bath, read your favorite magazine, or listen to your favorite music.  Avoid alcohol.   Ask for help with household chores, cooking, grocery shopping, or running errands as needed. Do not try to do everything.   Talk to people close to you about how you are feeling. Get support from your partner, family members, friends, or other new moms.  Try to stay positive in how you think. Think about the things you are grateful for.   Do not spend a lot of time alone.   Only take over-the-counter or prescription medicine as directed by your health care provider.  Keep all your postpartum appointments.   Let your health care provider know if you have any concerns.  SEEK MEDICAL CARE IF: You are having a reaction to or problems with your medicine. SEEK IMMEDIATE MEDICAL CARE IF:  You have suicidal feelings.   You think you may harm the baby or someone else. MAKE SURE YOU:  Understand these instructions.  Will watch your condition.  Will get help right away if you are not doing well or get worse. Document Released: 07/23/2004 Document Revised: 10/24/2013 Document Reviewed: 07/31/2013 [employer] Patient Information 2015 [employer]. This information is not intended to replace advice given to you by your health care provider. Make sure you discuss any questions you have with your health care provider.  

## 2014-11-26 NOTE — Progress Notes (Signed)
  Subjective:     Carla Haley is a 24 y.o. female who presents for a postpartum visit. She is 5 weeks postpartum following a spontaneous vaginal delivery. I have fully reviewed the prenatal and intrapartum course. The delivery was at 39 gestational weeks. Outcome: spontaneous vaginal delivery. Anesthesia: epidural. Postpartum course has been normal. Baby's course has been normal. Baby is feeding by breast. Bleeding no bleeding. Bowel function is normal. Bladder function is normal. Patient is not sexually active. Contraception method is none. Postpartum depression screening: negative.  The following portions of the patient's history were reviewed and updated as appropriate: allergies, current medications, past family history, past medical history, past social history, past surgical history and problem list.  Review of Systems Pertinent items are noted in HPI.   Objective:    BP 103/52 mmHg  Pulse 81  Temp(Src) 97.9 F (36.6 C) (Oral)  Ht 5\' 2"  (1.575 m)  Wt 114 lb (51.71 kg)  BMI 20.85 kg/m2  Breastfeeding? Yes  General:  alert, cooperative and no distress  Lungs: clear to auscultation bilaterally  Heart:  regular rate and rhythm, S1, S2 normal, no murmur, click, rub or gallop  Abdomen: soft, non-tender; bowel sounds normal; no masses,  no organomegaly   Vulva:  normal  Vagina: normal vagina, no discharge, exudate, lesion, or erythema        Assessment:     Normal postpartum exam. Pap smear not done at today's visit.   Plan:    1. Contraception: Undecided.  Discussed mirena and nexplanon.  Will use condoms 2. Follow up in: 1 year or as needed.

## 2015-01-03 ENCOUNTER — Encounter: Payer: Self-pay | Admitting: Obstetrics & Gynecology

## 2015-01-30 ENCOUNTER — Ambulatory Visit (INDEPENDENT_AMBULATORY_CARE_PROVIDER_SITE_OTHER): Payer: Medicaid Other | Admitting: Obstetrics & Gynecology

## 2015-01-30 ENCOUNTER — Ambulatory Visit: Payer: Medicaid Other | Admitting: Obstetrics & Gynecology

## 2015-01-30 DIAGNOSIS — N926 Irregular menstruation, unspecified: Secondary | ICD-10-CM

## 2015-01-30 DIAGNOSIS — Z3202 Encounter for pregnancy test, result negative: Secondary | ICD-10-CM | POA: Diagnosis not present

## 2015-01-30 LAB — POCT PREGNANCY, URINE: PREG TEST UR: NEGATIVE

## 2015-01-30 NOTE — Progress Notes (Signed)
Patient here today for contraception-- desires depo injection. Patient reports having unprotected intercourse yesterday. C/o intermittent spotting but no period since baby. UPT performed and negative. Patient advised to abstain from intercourse or use condoms until her appointment in two weeks-- condoms given to patient.  Patient to return for nurse visit/depo in at least two weeks during which time we will repeat UPT and if negative administer medication. Patient verbalizes understanding and is agreeable to plan. Patient did not see provider today.

## 2015-02-08 NOTE — Progress Notes (Signed)
   Subjective:    Patient ID: Carla HeidelbergLaurette Rabenold, female    DOB: 17-Jan-1991, 24 y.o.   MRN: [id number]  HPI  She did not get an appt today as she has had recent unprotected IC.  Review of Systems     Objective:   Physical Exam        Assessment & Plan:

## 2015-02-13 ENCOUNTER — Ambulatory Visit: Payer: Medicaid Other

## 2015-02-18 ENCOUNTER — Encounter: Payer: Self-pay | Admitting: Family Medicine

## 2015-02-18 ENCOUNTER — Ambulatory Visit (INDEPENDENT_AMBULATORY_CARE_PROVIDER_SITE_OTHER): Payer: Medicaid Other | Admitting: Family Medicine

## 2015-02-18 VITALS — BP 95/59 | HR 77 | Temp 98.4°F | Ht 63.25 in | Wt 106.5 lb

## 2015-02-18 DIAGNOSIS — O99345 Other mental disorders complicating the puerperium: Secondary | ICD-10-CM

## 2015-02-18 DIAGNOSIS — Z008 Encounter for other general examination: Secondary | ICD-10-CM

## 2015-02-18 DIAGNOSIS — F53 Postpartum depression: Secondary | ICD-10-CM

## 2015-02-18 DIAGNOSIS — Z0289 Encounter for other administrative examinations: Secondary | ICD-10-CM

## 2015-02-18 DIAGNOSIS — Z23 Encounter for immunization: Secondary | ICD-10-CM

## 2015-02-18 MED ORDER — SERTRALINE HCL 50 MG PO TABS: 50.0000 mg | ORAL_TABLET | Freq: Every day | ORAL | Status: DC

## 2015-02-18 NOTE — Progress Notes (Signed)
No interpreter utilized during today's visit.  Patient knows [place].    [facility] New [facility] Visit  HPI:  [facility] to establish general primary care, also to discuss not being able to eat after pregnancy.  She states that since she gave birth in December, she has not been eating.  After being asked, she also reports depressed mood.  Has not social supports, no one to discuss her concerns with.  Has not told her husband because she doesn't want him to see her upset.  Denies any SI/HI.  No manic symptoms.   Only really eats supper.  REst of day she denies hunger.  Does drink 3-4 cups of water a day.   ROS: See HPI  Immigrant Social History: - Name spelling correct?:  - Date arrived in [place]: October 2015 - [place] - Location of refugee camp (if applicable), how long there, and what caused patient to leave home country?: [place]led to [place] in 2012, never actually lived in refugee camp.   - Primary language: Swahili.  [place].  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  - Tobacco/alcohol/drug use: denies - Marriage Status: married with 1 child - Sexual activity: vaginal.  - Class A/B conditions: denies.    Past Medical Hx:  -No real past medical history.  G1P1, [facility] [place]  Past Surgical Hx:  -none.   Family Hx: updated in Epic - Number of family members:  Only husband and son.  [place], [place] in [place].  She fled there as a refugee.   PHYSICAL EXAM: BP 95/59 mmHg  Pulse 77  Temp(Src) 98.4 F (36.9 C) (Oral)  Ht 5' 3.25" (1.607 m)  Wt 106 lb 8 oz (48.308 kg)  BMI 18.71 kg/m2  LMP 02/06/2015 (Exact Date) Gen: Well NAD HEENT: EOMI,  MMM Lungs: CTABL Nl WOB Heart: RRR no MRG Abd: NABS, NT, ND Exts: Non edematous BL  LE, warm and well perfused.  Psych:  Good  affect at first, became more depressed appearing as we talked and her "mask" lowered.  Never overtly tearful.  No hallucinations.

## 2015-02-18 NOTE — Patient Instructions (Addendum)
Take the Sertraline 1 pill a day to help with mood and appetite.  Start drinking either Boost or Ensure shakes for breakfast and dinner.  You can get this at the pharmacy.  Restart your prenatal vitamin.  Come back to see me in 2 weeks so I know you're doing okay.

## 2015-02-20 DIAGNOSIS — Z3046 Encounter for surveillance of implantable subdermal contraceptive: Secondary | ICD-10-CM | POA: Insufficient documentation

## 2015-02-20 DIAGNOSIS — F53 Postpartum depression: Secondary | ICD-10-CM | POA: Insufficient documentation

## 2015-02-20 DIAGNOSIS — Z0289 Encounter for other administrative examinations: Secondary | ICD-10-CM | POA: Insufficient documentation

## 2015-02-20 DIAGNOSIS — O99345 Other mental disorders complicating the puerperium: Secondary | ICD-10-CM

## 2015-02-20 DIAGNOSIS — Z30017 Encounter for initial prescription of implantable subdermal contraceptive: Secondary | ICD-10-CM | POA: Insufficient documentation

## 2015-02-20 NOTE — Assessment & Plan Note (Addendum)
Long discussion normalizing this for her.   She believed she was a "bad mother" because she was often sad. Discussed that her lack of appetite stems from this. Sertraline to treat.  See in 2 weeks.  She has no red flags/suicidal ideation.   Can use protein shakes as supplementation for now. Restart prenatal vitamins due to lack of good nutritional intake.  Evidently baby gaining weight well, by report.

## 2015-02-20 NOTE — Assessment & Plan Note (Signed)
Contacting health dept for further details.

## 2015-03-04 ENCOUNTER — Ambulatory Visit: Payer: Medicaid Other | Admitting: Family Medicine

## 2015-04-15 ENCOUNTER — Emergency Department (HOSPITAL_COMMUNITY)
Admission: EM | Admit: 2015-04-15 | Discharge: 2015-04-15 | Disposition: A | Discharge status: Home / Self Care | Payer: Medicaid Other | Attending: Emergency Medicine | Admitting: Emergency Medicine

## 2015-04-15 ENCOUNTER — Encounter (HOSPITAL_COMMUNITY): Payer: Self-pay | Admitting: Emergency Medicine

## 2015-04-15 ENCOUNTER — Emergency Department (HOSPITAL_COMMUNITY): Payer: Medicaid Other

## 2015-04-15 DIAGNOSIS — R110 Nausea: Secondary | ICD-10-CM | POA: Diagnosis not present

## 2015-04-15 DIAGNOSIS — O26899 Other specified pregnancy related conditions, unspecified trimester: Secondary | ICD-10-CM

## 2015-04-15 DIAGNOSIS — Z79899 Other long term (current) drug therapy: Secondary | ICD-10-CM | POA: Insufficient documentation

## 2015-04-15 DIAGNOSIS — Z3490 Encounter for supervision of normal pregnancy, unspecified, unspecified trimester: Secondary | ICD-10-CM

## 2015-04-15 DIAGNOSIS — R1084 Generalized abdominal pain: Secondary | ICD-10-CM | POA: Diagnosis not present

## 2015-04-15 DIAGNOSIS — O9989 Other specified diseases and conditions complicating pregnancy, childbirth and the puerperium: Secondary | ICD-10-CM | POA: Diagnosis not present

## 2015-04-15 DIAGNOSIS — Z3A01 Less than 8 weeks gestation of pregnancy: Secondary | ICD-10-CM | POA: Diagnosis not present

## 2015-04-15 DIAGNOSIS — R109 Unspecified abdominal pain: Secondary | ICD-10-CM

## 2015-04-15 LAB — CBC WITH DIFFERENTIAL/PLATELET
BASOS ABS: 0 10*3/uL (ref 0.0–0.1)
Basophils Relative: 1 % (ref 0–1)
Eosinophils Absolute: 0.3 10*3/uL (ref 0.0–0.7)
Eosinophils Relative: 5 % (ref 0–5)
HCT: 34.9 % — ABNORMAL LOW (ref 36.0–46.0)
Hemoglobin: 11.8 g/dL — ABNORMAL LOW (ref 12.0–15.0)
LYMPHS ABS: 2.7 10*3/uL (ref 0.7–4.0)
LYMPHS PCT: 44 % (ref 12–46)
MCH: 29.1 pg (ref 26.0–34.0)
MCHC: 33.8 g/dL (ref 30.0–36.0)
MCV: 86 fL (ref 78.0–100.0)
Monocytes Absolute: 0.3 10*3/uL (ref 0.1–1.0)
Monocytes Relative: 6 % (ref 3–12)
NEUTROS ABS: 2.8 10*3/uL (ref 1.7–7.7)
NEUTROS PCT: 44 % (ref 43–77)
PLATELETS: 307 10*3/uL (ref 150–400)
RBC: 4.06 MIL/uL (ref 3.87–5.11)
RDW: 12.5 % (ref 11.5–15.5)
WBC: 6.1 10*3/uL (ref 4.0–10.5)

## 2015-04-15 LAB — COMPREHENSIVE METABOLIC PANEL
ALT: 12 U/L — AB (ref 14–54)
AST: 16 U/L (ref 15–41)
Albumin: 3.7 g/dL (ref 3.5–5.0)
Alkaline Phosphatase: 85 U/L (ref 38–126)
Anion gap: 5 (ref 5–15)
BUN: 9 mg/dL (ref 6–20)
CO2: 24 mmol/L (ref 22–32)
Calcium: 8.8 mg/dL — ABNORMAL LOW (ref 8.9–10.3)
Chloride: 109 mmol/L (ref 101–111)
Creatinine, Ser: 0.65 mg/dL (ref 0.44–1.00)
GFR calc Af Amer: >60 mL/min (ref >60)
GFR calc non Af Amer: >60 mL/min (ref >60)
GLUCOSE: 84 mg/dL (ref 65–99)
POTASSIUM: 3.8 mmol/L (ref 3.5–5.1)
Sodium: 138 mmol/L (ref 135–145)
TOTAL PROTEIN: 6.7 g/dL (ref 6.5–8.1)
Total Bilirubin: 0.5 mg/dL (ref 0.3–1.2)

## 2015-04-15 LAB — PREGNANCY, URINE: PREG TEST UR: POSITIVE — AB

## 2015-04-15 LAB — HCG, QUANTITATIVE, PREGNANCY: hCG, Beta Chain, Quant, S: 1143 m[IU]/mL — ABNORMAL HIGH (ref <5)

## 2015-04-15 LAB — LIPASE, BLOOD: Lipase: 28 U/L (ref 22–51)

## 2015-04-15 LAB — POC URINE PREG, ED: PREG TEST UR: POSITIVE — AB

## 2015-04-15 NOTE — ED Provider Notes (Signed)
CSN: [id number]     Arrival date & time 04/15/15  1714 History   First MD Initiated Contact with Patient 04/15/15 1810     Chief Complaint  Patient presents with  . Abdominal Pain     (Consider location/radiation/quality/duration/timing/severity/associated sxs/prior Treatment) Patient is a 24 y.o. female presenting with abdominal pain. The history is provided by the patient.  Abdominal Pain She has been having intermittent abdominal pain for the last week. Pain is generalized with associated nausea but no vomiting. There's been no constipation or diarrhea. She denies any dysuria. She denies fever, chills, sweats. Last menses was May 9 and is normal. She is not using any contraception. She is also been having nosebleeds fairly frequently. She is 6 months postpartum. She is gravida 1, para 1.  Past Medical History  Diagnosis Date  . Medical history non-contributory    Past Surgical History  Procedure Laterality Date  . No past surgeries     No family history on file. History  Substance Use Topics  . Smoking status: Never Smoker   . Smokeless tobacco: Not on file  . Alcohol Use: No   OB History    Gravida Para Term Preterm AB TAB SAB Ectopic Multiple Living   1 1 1  0 0 0 0 0 0 1     Review of Systems  Gastrointestinal: Positive for abdominal pain.  All other systems reviewed and are negative.     Allergies  Review of patient's allergies indicates no known allergies.  Home Medications   Prior to Admission medications   Medication Sig Start Date End Date Taking? Authorizing Provider  ibuprofen (ADVIL,MOTRIN) 600 MG tablet Take 1 tablet (600 mg total) by mouth every 6 (six) hours. 10/25/14   Aviva Signs, CNM  oxyCODONE-acetaminophen (PERCOCET/ROXICET) 5-325 MG per tablet Take 1 tablet by mouth every 4 (four) hours as needed (for pain scale less than 7). 10/25/14   Aviva Signs, CNM  Prenatal Vit-Fe Fumarate-FA (PRENATAL MULTIVITAMIN) TABS tablet Take 1 tablet by  mouth daily at 12 noon.    Historical Provider, MD  sertraline (ZOLOFT) 50 MG tablet Take 1 tablet (50 mg total) by mouth daily. 02/18/15   Tobey Grim, MD   BP 114/72 mmHg  Pulse 92  Temp(Src) 97.5 F (36.4 C) (Oral)  Resp 18  Ht 5\' 4"  (1.626 m)  Wt 104 lb 14.4 oz (47.582 kg)  BMI 18.00 kg/m2  SpO2 99%  LMP 03/11/2015 Physical Exam  Nursing note and vitals reviewed.  24 year old female, resting comfortably and in no acute distress. Vital signs are normal. Oxygen saturation is 99%, which is normal. Head is normocephalic and atraumatic. PERRLA, EOMI. Oropharynx is clear. Neck is nontender and supple without adenopathy or JVD. Back is nontender and there is no CVA tenderness. Lungs are clear without rales, wheezes, or rhonchi. Chest is nontender. Heart has regular rate and rhythm without murmur. Abdomen is soft, flat, with mild tenderness rather diffusely. Tenderness seems to be worse in the right mid and upper abdomen. There is no rebound or guarding. There are no masses or hepatosplenomegaly and peristalsis is normoactive. Extremities have no cyanosis or edema, full range of motion is present. Skin is warm and dry without rash. Neurologic: Mental status is normal, cranial nerves are intact, there are no motor or sensory deficits.  ED Course  Procedures (including critical care time) Labs Review Results for orders placed or performed during the hospital encounter of 04/15/15  CBC with Differential  Result Value Ref Range   WBC 6.1 4.0 - 10.5 K/uL   RBC 4.06 3.87 - 5.11 MIL/uL   Hemoglobin 11.8 (L) 12.0 - 15.0 g/dL   HCT 16.1 (L) 09.6 - 04.5 %   MCV 86.0 78.0 - 100.0 fL   MCH 29.1 26.0 - 34.0 pg   MCHC 33.8 30.0 - 36.0 g/dL   RDW 40.9 81.1 - 91.4 %   Platelets 307 150 - 400 K/uL   Neutrophils Relative % 44 43 - 77 %   Neutro Abs 2.8 1.7 - 7.7 K/uL   Lymphocytes Relative 44 12 - 46 %   Lymphs Abs 2.7 0.7 - 4.0 K/uL   Monocytes Relative 6 3 - 12 %   Monocytes Absolute  0.3 0.1 - 1.0 K/uL   Eosinophils Relative 5 0 - 5 %   Eosinophils Absolute 0.3 0.0 - 0.7 K/uL   Basophils Relative 1 0 - 1 %   Basophils Absolute 0.0 0.0 - 0.1 K/uL  Comprehensive metabolic panel  Result Value Ref Range   Sodium 138 135 - 145 mmol/L   Potassium 3.8 3.5 - 5.1 mmol/L   Chloride 109 101 - 111 mmol/L   CO2 24 22 - 32 mmol/L   Glucose, Bld 84 65 - 99 mg/dL   BUN 9 6 - 20 mg/dL   Creatinine, Ser 7.82 0.44 - 1.00 mg/dL   Calcium 8.8 (L) 8.9 - 10.3 mg/dL   Total Protein 6.7 6.5 - 8.1 g/dL   Albumin 3.7 3.5 - 5.0 g/dL   AST 16 15 - 41 U/L   ALT 12 (L) 14 - 54 U/L   Alkaline Phosphatase 85 38 - 126 U/L   Total Bilirubin 0.5 0.3 - 1.2 mg/dL   GFR calc non Af Amer >60 >60 mL/min   GFR calc Af Amer >60 >60 mL/min   Anion gap 5 5 - 15  Lipase, blood  Result Value Ref Range   Lipase 28 22 - 51 U/L  Pregnancy, urine  Result Value Ref Range   Preg Test, Ur POSITIVE (A) NEGATIVE  hCG, quantitative, pregnancy  Result Value Ref Range   hCG, Beta Chain, Quant, S 1143 (H) <5 mIU/mL  POC Urine Pregnancy, ED  (If Pre-menopausal female)  not at [facility]  Result Value Ref Range   Preg Test, Ur POSITIVE (A) NEGATIVE   Imaging Review US Ob Comp Less 14 Wks  04/15/2015   CLINICAL DATA:  Acute onset of generalized abdominal pain for 5 days. Initial encounter.  EXAM: OBSTETRIC <14 WK [place] AND TRANSVAGINAL OB US  TECHNIQUE: Both transabdominal and transvaginal ultrasound examinations were performed for complete evaluation of the gestation as well as the maternal uterus, adnexal regions, and pelvic cul-de-sac. Transvaginal technique was performed to assess early pregnancy.  COMPARISON:  Pelvic ultrasound performed 10/18/2014  FINDINGS: Intrauterine gestational sac: A tiny intrauterine gestational sac is suggested.  Yolk sac:  No  Embryo:  No  Cardiac Activity: N/A  MSD: 2.8 mm   5 w   0  d  Maternal uterus/adnexae: No subchorionic hemorrhage is noted. The uterus otherwise unremarkable in appearance.   The ovaries are within normal limits. The right ovary measures 2.1 x 1.3 x 1.6 cm, while the left ovary measures 4.3 x 2.0 x 4.2 cm. No suspicious adnexal masses are seen; there is no evidence for ovarian torsion.  Trace free fluid is seen within the pelvic cul-de-sac.  IMPRESSION: Suggestion of tiny intrauterine gestational sac, measuring 3 mm in mean  sac diameter. This corresponds to a gestational age of [redacted] weeks 0 days, which matches the gestational age by LMP, and reflects an estimated date of delivery of December 16, 2015. No embryo or yolk sac is yet seen, within normal limits.   Electronically Signed   By: Roanna Raider M.D.   On: 04/15/2015 21:14   US Ob Transvaginal  04/15/2015   CLINICAL DATA:  Acute onset of generalized abdominal pain for 5 days. Initial encounter.  EXAM: OBSTETRIC <14 WK [place] AND TRANSVAGINAL OB US  TECHNIQUE: Both transabdominal and transvaginal ultrasound examinations were performed for complete evaluation of the gestation as well as the maternal uterus, adnexal regions, and pelvic cul-de-sac. Transvaginal technique was performed to assess early pregnancy.  COMPARISON:  Pelvic ultrasound performed 10/18/2014  FINDINGS: Intrauterine gestational sac: A tiny intrauterine gestational sac is suggested.  Yolk sac:  No  Embryo:  No  Cardiac Activity: N/A  MSD: 2.8 mm   5 w   0  d  Maternal uterus/adnexae: No subchorionic hemorrhage is noted. The uterus otherwise unremarkable in appearance.  The ovaries are within normal limits. The right ovary measures 2.1 x 1.3 x 1.6 cm, while the left ovary measures 4.3 x 2.0 x 4.2 cm. No suspicious adnexal masses are seen; there is no evidence for ovarian torsion.  Trace free fluid is seen within the pelvic cul-de-sac.  IMPRESSION: Suggestion of tiny intrauterine gestational sac, measuring 3 mm in mean sac diameter. This corresponds to a gestational age of [redacted] weeks 0 days, which matches the gestational age by LMP, and reflects an estimated date of delivery  of December 16, 2015. No embryo or yolk sac is yet seen, within normal limits.   Electronically Signed   By: Roanna Raider M.D.   On: 04/15/2015 21:14     MDM   Final diagnoses:  Pregnancy  Abdominal pain, unspecified abdominal location    Vague abdominal discomfort. Pregnancy test is come back positive. She will need ultrasound to make sure she has an intrauterine pregnancy. Old records are reviewed and she apparently had a visit to considered Depo-Provera for contraception but never received the injection.  Ultrasound confirms intrauterine pregnancy. She is referred to women's clinic for follow-up.  Dione Booze, MD 04/15/15 [phone]

## 2015-04-15 NOTE — ED Notes (Signed)
MD Glick at the bedside. 

## 2015-04-15 NOTE — Discharge Instructions (Signed)

## 2015-04-15 NOTE — ED Notes (Signed)
Pt also reports frequent nose bleeds.

## 2015-04-15 NOTE — ED Notes (Signed)
Pt c.o abdominal pain x 1 week with nausea. lmp- 03/11/15

## 2015-04-23 IMAGING — US US FETAL BPP W/O NONSTRESS
1 series · 13 of 20 positions shown · non-contrast
Comparison: none

[Series 1: us fetal bpp w/o nonstress · non-contrast · 20 acquisitions, 13 frames shown]
[im 1/20]
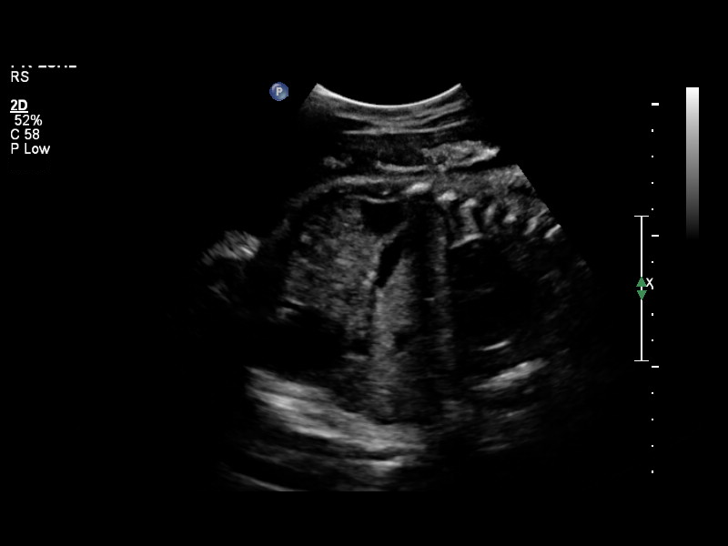
[im 3/20]
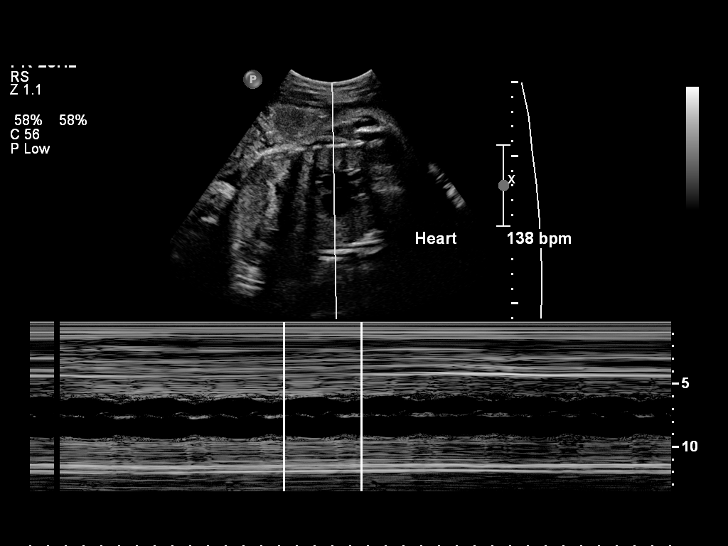
[im 4/20]
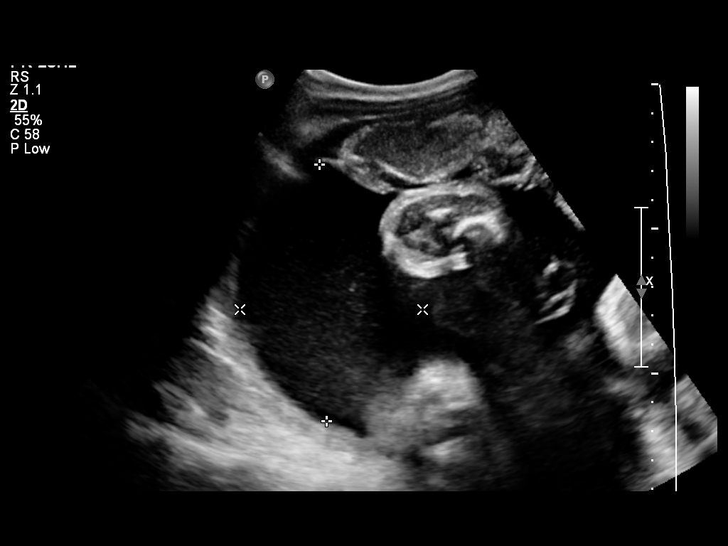
[im 6/20]
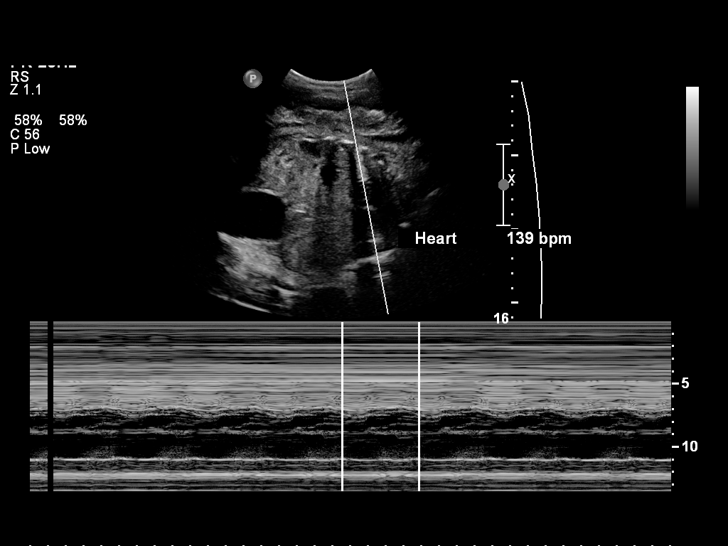
[im 7/20]
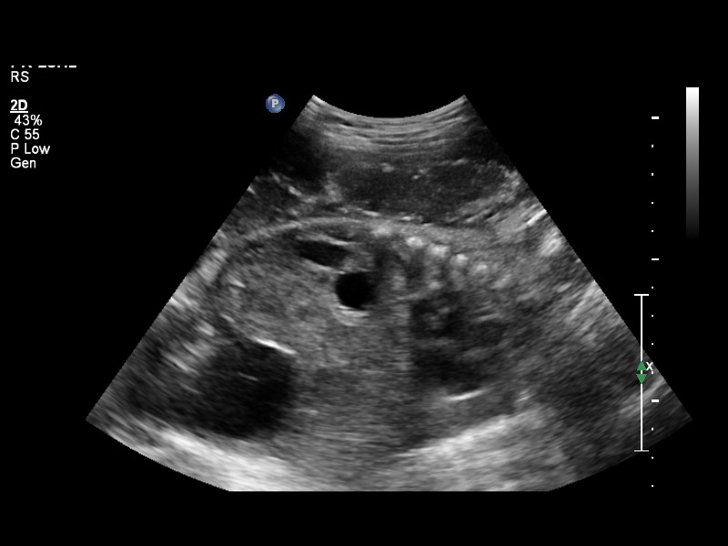
[im 9/20]
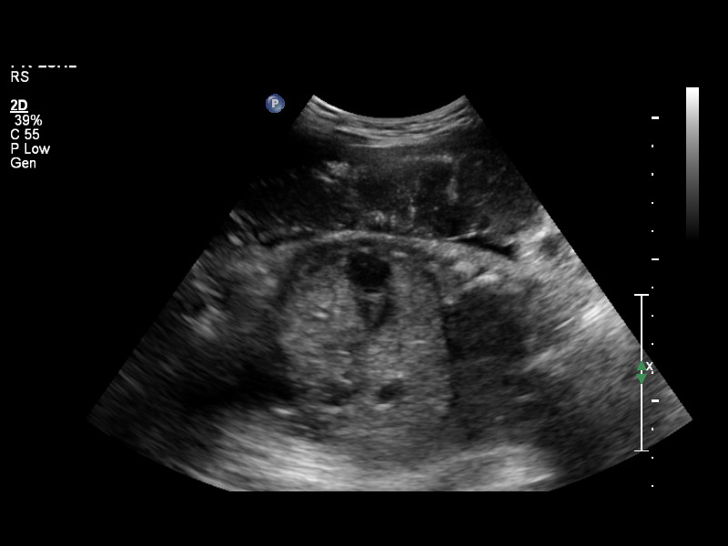
[im 11/20]
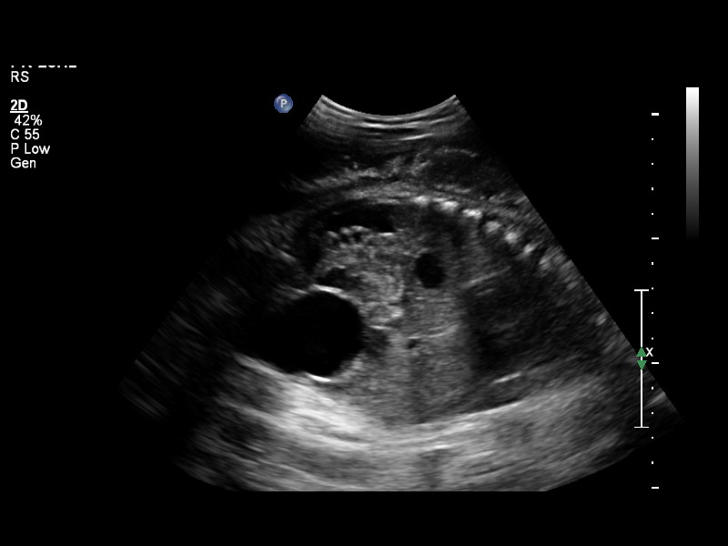
[im 12/20]
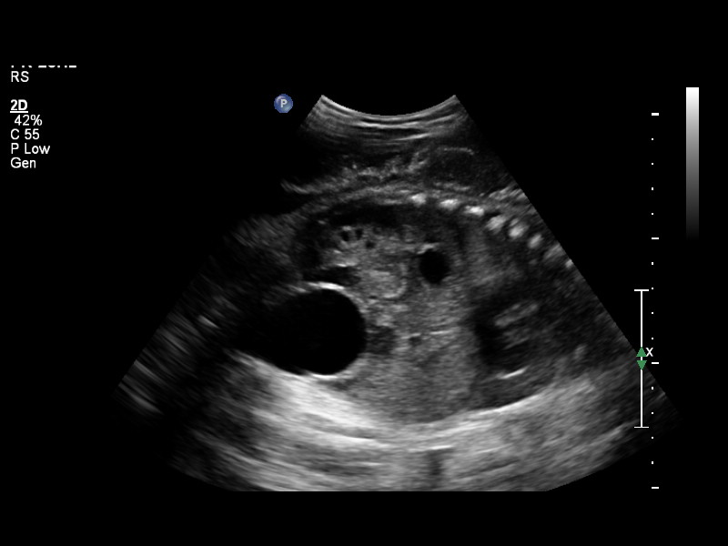
[im 14/20]
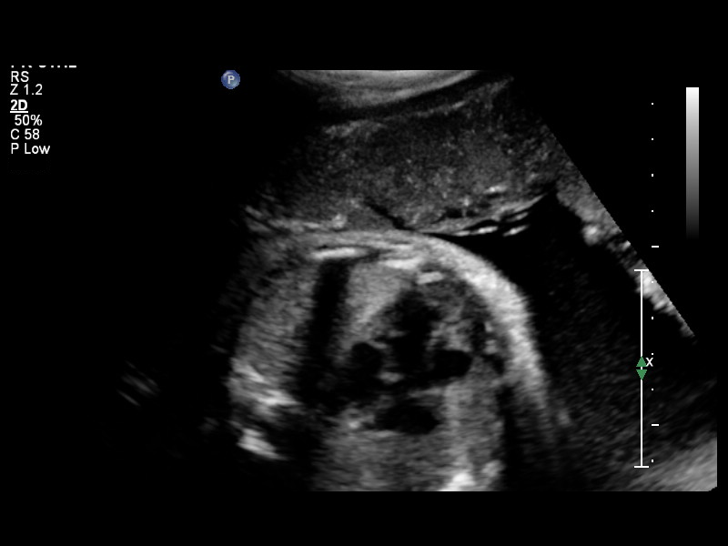
[im 15/20]
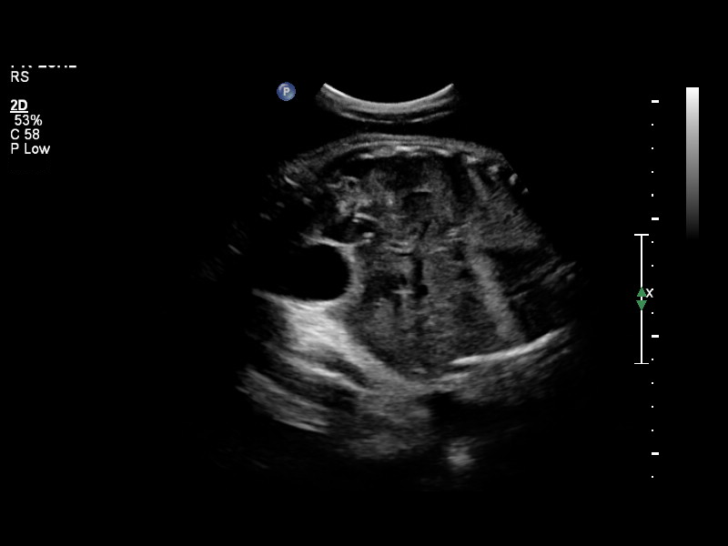
[im 17/20]
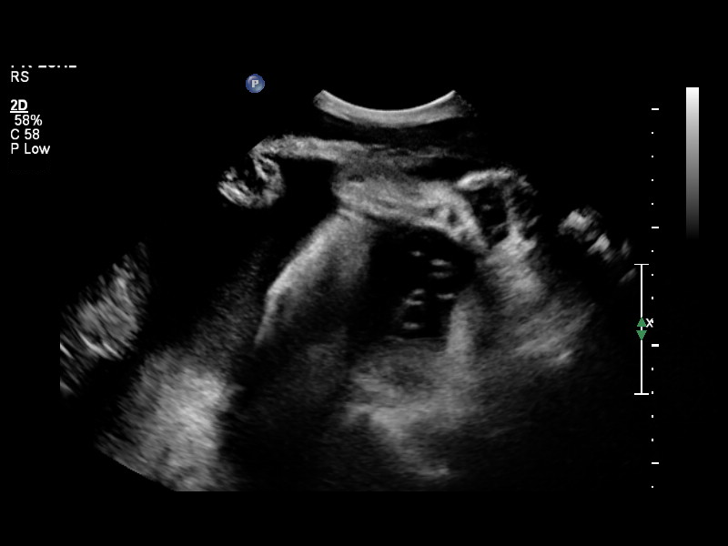
[im 18/20]
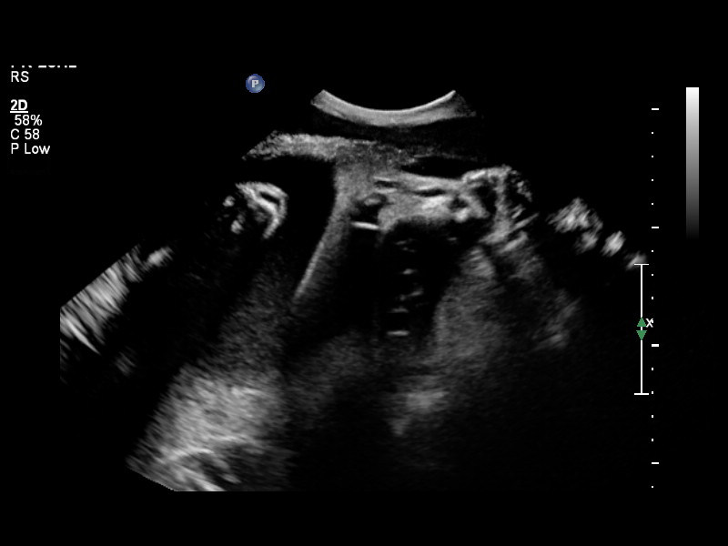
[im 20/20]
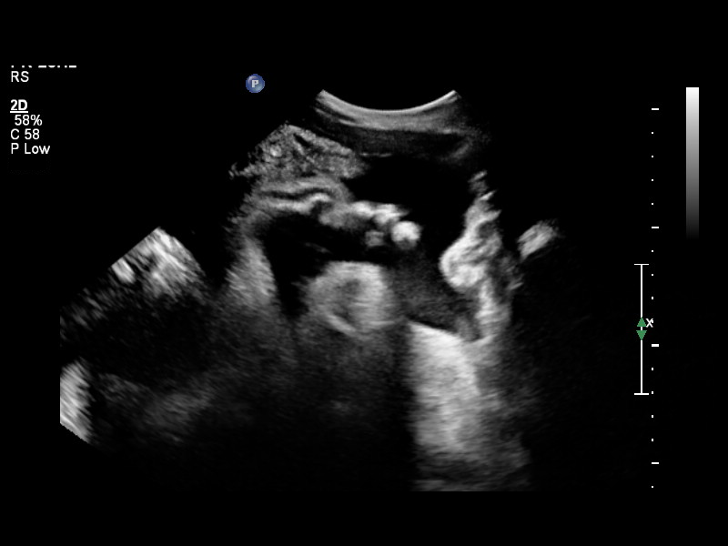

[13 of 20 positions shown; findings below may reference images not displayed]

OBSTETRICS REPORT
                      (Signed Final 09/24/2014 [DATE])

Service(s) Provided

Indications

 Non-reactive NST, FHR decelerations
 34 weeks gestation of pregnancy
 Polyhydramnios
 Echogenic intracardiac focus of the heart  (EIF) -
 left ventricle
Fetal Evaluation

 Num Of Fetuses:    1
 Fetal Heart Rate:  138                          bpm
 Cardiac Activity:  Observed
 Presentation:      Cephalic

 Amniotic Fluid
 AFI FV:      Polyhydramnios
                                              Larg Pckt:    8.9  cm
Biophysical Evaluation

 Amniotic F.V:   Pocket => 2 cm two         F. Tone:         Observed
                 planes
 F. Movement:    Observed                   Score:           [DATE]
 F. Breathing:   Observed
Gestational Age

 LMP:           34w 6d        Date:  01/23/14                 EDD:   10/30/14
 Best:          34w 6d     Det. By:  LMP  (01/23/14)          EDD:   10/30/14
Impression

 Single IUP at 34w 6d
 BPP [DATE]
 Polyhydrmnios with max verical pocek to 8.9 cm

Recommendations

 Recommend continued antenatal testing as scheduled
 (weekly BPPs or twice weekly NSTs with weekly AFI)
 If polyhydramnios remainds persistent, recommend delivery
 at 39 weeks.
 Recommend ultrasound for growth in the next 1-2 weeks if
 not already scheduled.

 questions or concerns.

## 2015-05-23 LAB — OB RESULTS CONSOLE GC/CHLAMYDIA
CHLAMYDIA, DNA PROBE: NEGATIVE
GC PROBE AMP, GENITAL: NEGATIVE

## 2015-05-23 LAB — OB RESULTS CONSOLE ANTIBODY SCREEN: Antibody Screen: NEGATIVE

## 2015-05-23 LAB — OB RESULTS CONSOLE HIV ANTIBODY (ROUTINE TESTING): HIV: NONREACTIVE

## 2015-05-23 LAB — OB RESULTS CONSOLE RUBELLA ANTIBODY, IGM: Rubella: IMMUNE

## 2015-05-23 LAB — OB RESULTS CONSOLE HEPATITIS B SURFACE ANTIGEN: HEP B S AG: NEGATIVE

## 2015-05-23 LAB — OB RESULTS CONSOLE ABO/RH: RH TYPE: POSITIVE

## 2015-05-23 LAB — OB RESULTS CONSOLE RPR: RPR: NONREACTIVE

## 2015-06-10 ENCOUNTER — Other Ambulatory Visit (HOSPITAL_COMMUNITY): Payer: Self-pay | Admitting: Nurse Practitioner

## 2015-06-10 DIAGNOSIS — Z3A13 13 weeks gestation of pregnancy: Secondary | ICD-10-CM

## 2015-06-10 DIAGNOSIS — Z3682 Encounter for antenatal screening for nuchal translucency: Secondary | ICD-10-CM

## 2015-06-11 ENCOUNTER — Ambulatory Visit (HOSPITAL_COMMUNITY)
Admission: RE | Admit: 2015-06-11 | Discharge: 2015-06-11 | Disposition: A | Discharge status: Home / Self Care | Payer: Medicaid Other | Source: Ambulatory Visit | Attending: Nurse Practitioner | Admitting: Nurse Practitioner

## 2015-06-11 ENCOUNTER — Encounter (HOSPITAL_COMMUNITY): Payer: Self-pay

## 2015-06-11 DIAGNOSIS — Z3A13 13 weeks gestation of pregnancy: Secondary | ICD-10-CM | POA: Diagnosis not present

## 2015-06-11 DIAGNOSIS — Z36 Encounter for antenatal screening of mother: Secondary | ICD-10-CM | POA: Insufficient documentation

## 2015-06-11 DIAGNOSIS — Z3682 Encounter for antenatal screening for nuchal translucency: Secondary | ICD-10-CM

## 2015-06-12 ENCOUNTER — Other Ambulatory Visit (HOSPITAL_COMMUNITY): Payer: Self-pay | Admitting: Nurse Practitioner

## 2015-06-20 ENCOUNTER — Other Ambulatory Visit (HOSPITAL_COMMUNITY): Payer: Self-pay | Admitting: Nurse Practitioner

## 2015-07-11 ENCOUNTER — Encounter (HOSPITAL_COMMUNITY): Payer: Self-pay

## 2015-07-11 ENCOUNTER — Inpatient Hospital Stay (HOSPITAL_COMMUNITY)
Admission: AD | Admit: 2015-07-11 | Discharge: 2015-07-11 | Disposition: A | Discharge status: Home / Self Care | Payer: Medicaid Other | Source: Ambulatory Visit | Attending: Family Medicine | Admitting: Family Medicine

## 2015-07-11 DIAGNOSIS — O26893 Other specified pregnancy related conditions, third trimester: Secondary | ICD-10-CM | POA: Diagnosis not present

## 2015-07-11 DIAGNOSIS — O26892 Other specified pregnancy related conditions, second trimester: Secondary | ICD-10-CM

## 2015-07-11 DIAGNOSIS — Z7982 Long term (current) use of aspirin: Secondary | ICD-10-CM | POA: Insufficient documentation

## 2015-07-11 DIAGNOSIS — Z3A00 Weeks of gestation of pregnancy not specified: Secondary | ICD-10-CM | POA: Diagnosis not present

## 2015-07-11 DIAGNOSIS — R51 Headache: Secondary | ICD-10-CM

## 2015-07-11 LAB — COMPREHENSIVE METABOLIC PANEL
ALK PHOS: 52 U/L (ref 38–126)
ALT: 14 U/L (ref 14–54)
ANION GAP: 8 (ref 5–15)
AST: 19 U/L (ref 15–41)
Albumin: 3.3 g/dL — ABNORMAL LOW (ref 3.5–5.0)
BUN: 7 mg/dL (ref 6–20)
CALCIUM: 8.5 mg/dL — AB (ref 8.9–10.3)
CO2: 22 mmol/L (ref 22–32)
CREATININE: 0.44 mg/dL (ref 0.44–1.00)
Chloride: 102 mmol/L (ref 101–111)
Glucose, Bld: 77 mg/dL (ref 65–99)
Potassium: 3.7 mmol/L (ref 3.5–5.1)
Sodium: 132 mmol/L — ABNORMAL LOW (ref 135–145)
TOTAL PROTEIN: 6.9 g/dL (ref 6.5–8.1)
Total Bilirubin: 0.4 mg/dL (ref 0.3–1.2)

## 2015-07-11 LAB — CBC
HCT: 32.1 % — ABNORMAL LOW (ref 36.0–46.0)
HEMOGLOBIN: 10.8 g/dL — AB (ref 12.0–15.0)
MCH: 30.6 pg (ref 26.0–34.0)
MCHC: 33.6 g/dL (ref 30.0–36.0)
MCV: 90.9 fL (ref 78.0–100.0)
Platelets: 216 10*3/uL (ref 150–400)
RBC: 3.53 MIL/uL — AB (ref 3.87–5.11)
RDW: 12.1 % (ref 11.5–15.5)
WBC: 4.1 10*3/uL (ref 4.0–10.5)

## 2015-07-11 LAB — URINALYSIS, ROUTINE W REFLEX MICROSCOPIC
Glucose, UA: NEGATIVE mg/dL
Hgb urine dipstick: NEGATIVE
KETONES UR: 40 mg/dL — AB
LEUKOCYTES UA: NEGATIVE
NITRITE: NEGATIVE
Protein, ur: NEGATIVE mg/dL
Specific Gravity, Urine: >1.03 — ABNORMAL HIGH (ref 1.005–1.030)
UROBILINOGEN UA: 0.2 mg/dL (ref 0.0–1.0)
pH: 5.5 (ref 5.0–8.0)

## 2015-07-11 MED ORDER — LACTATED RINGERS IV BOLUS (SEPSIS)
1000.0000 mL | Freq: Once | INTRAVENOUS | Status: AC
  Administered 2015-07-11: 1000 mL via INTRAVENOUS

## 2015-07-11 MED ORDER — BUTALBITAL-APAP-CAFFEINE 50-325-40 MG PO TABS: 1.0000 | ORAL_TABLET | Freq: Four times a day (QID) | ORAL | Status: DC | PRN

## 2015-07-11 MED ORDER — LACTATED RINGERS IV BOLUS (SEPSIS): 1000.0000 mL | Freq: Once | INTRAVENOUS | Status: DC

## 2015-07-11 MED ORDER — DIPHENHYDRAMINE HCL 50 MG/ML IJ SOLN: 25.0000 mg | Freq: Once | INTRAMUSCULAR | Status: DC

## 2015-07-11 MED ORDER — DEXAMETHASONE SODIUM PHOSPHATE 10 MG/ML IJ SOLN: 10.0000 mg | Freq: Once | INTRAMUSCULAR | Status: DC

## 2015-07-11 MED ORDER — METOCLOPRAMIDE HCL 5 MG/ML IJ SOLN
10.0000 mg | Freq: Once | INTRAMUSCULAR | Status: AC
  Administered 2015-07-11: 10 mg via INTRAVENOUS
  Filled 2015-07-11: qty 2

## 2015-07-11 MED ORDER — DEXAMETHASONE SODIUM PHOSPHATE 10 MG/ML IJ SOLN
10.0000 mg | Freq: Once | INTRAMUSCULAR | Status: AC
  Administered 2015-07-11: 10 mg via INTRAVENOUS
  Filled 2015-07-11: qty 1

## 2015-07-11 MED ORDER — DIPHENHYDRAMINE HCL 50 MG/ML IJ SOLN
12.5000 mg | Freq: Once | INTRAMUSCULAR | Status: AC
  Administered 2015-07-11: 12.5 mg via INTRAVENOUS
  Filled 2015-07-11: qty 1

## 2015-07-11 MED ORDER — METOCLOPRAMIDE HCL 5 MG/ML IJ SOLN: 10.0000 mg | Freq: Once | INTRAMUSCULAR | Status: DC

## 2015-07-11 MED ORDER — DEXTROSE 5 % IN LACTATED RINGERS IV BOLUS: 1000.0000 mL | Freq: Once | INTRAVENOUS | Status: DC

## 2015-07-11 NOTE — MAU Note (Signed)
Pt stated she has a headache since yesterday. Feeling dizzy and lightheaded. Also c/o abd pain. Had a head like this 2 weeks ago that lasted 3 days.

## 2015-07-11 NOTE — MAU Provider Note (Signed)
History     CSN: [id number]  Arrival date and time: 07/11/15 1333   First Provider Initiated Contact with Patient 07/11/15 1444      Chief Complaint  Patient presents with  . Headache   HPI  Ms.Carla Haley is a 24 y.o. female G2P1001 here today with a HA; the HA started yesterday. She has not taken anything for the HA. She has had HA in the past, however none this strong. She has been nauseated and has vomited, however none in the last 24 hours.  She currently rates her HA pain 6/10  OB History    Gravida Para Term Preterm AB TAB SAB Ectopic Multiple Living   '2 1 1 ' 0 0 0 0 0 0 1      Past Medical History  Diagnosis Date  . Medical history non-contributory     Past Surgical History  Procedure Laterality Date  . No past surgeries      History reviewed. No pertinent family history.  Social History  Substance Use Topics  . Smoking status: Never Smoker   . Smokeless tobacco: None  . Alcohol Use: No    Allergies: No Known Allergies  Prescriptions prior to admission  Medication Sig Dispense Refill Last Dose  . aspirin EC 325 MG tablet Take 325 mg by mouth daily.   Past Week at Unknown time  . ibuprofen (ADVIL,MOTRIN) 600 MG tablet Take 1 tablet (600 mg total) by mouth every 6 (six) hours. (Patient not taking: Reported on 04/15/2015) 30 tablet 0 Not Taking at Unknown time  . oxyCODONE-acetaminophen (PERCOCET/ROXICET) 5-325 MG per tablet Take 1 tablet by mouth every 4 (four) hours as needed (for pain scale less than 7). (Patient not taking: Reported on 04/15/2015) 30 tablet 0 Not Taking at Unknown time  . Prenatal Vit-Fe Fumarate-FA (PRENATAL MULTIVITAMIN) TABS tablet Take 1 tablet by mouth every 7 (seven) days. Take only once a week per patient   Past Week at Unknown time  . sertraline (ZOLOFT) 50 MG tablet Take 1 tablet (50 mg total) by mouth daily. (Patient not taking: Reported on 04/15/2015) 30 tablet 3 Not Taking at Unknown time   Results for orders placed or  performed during the hospital encounter of 07/11/15 (from the past 48 hour(s))  Urinalysis, Routine w reflex microscopic (not at [facility])     Status: Abnormal   Collection Time: 07/11/15  1:30 PM  Result Value Ref Range   Color, Urine YELLOW YELLOW   APPearance CLEAR CLEAR   Specific Gravity, Urine >1.030 (H) 1.005 - 1.030   pH 5.5 5.0 - 8.0   Glucose, UA NEGATIVE NEGATIVE mg/dL   Hgb urine dipstick NEGATIVE NEGATIVE   Bilirubin Urine SMALL (A) NEGATIVE   Ketones, ur 40 (A) NEGATIVE mg/dL   Protein, ur NEGATIVE NEGATIVE mg/dL   Urobilinogen, UA 0.2 0.0 - 1.0 mg/dL   Nitrite NEGATIVE NEGATIVE   Leukocytes, UA NEGATIVE NEGATIVE    Comment: MICROSCOPIC NOT DONE ON URINES WITH NEGATIVE PROTEIN, BLOOD, LEUKOCYTES, NITRITE, OR GLUCOSE <1000 mg/dL.  CBC     Status: Abnormal   Collection Time: 07/11/15  3:04 PM  Result Value Ref Range   WBC 4.1 4.0 - 10.5 K/uL   RBC 3.53 (L) 3.87 - 5.11 MIL/uL   Hemoglobin 10.8 (L) 12.0 - 15.0 g/dL   HCT 32.1 (L) 36.0 - 46.0 %   MCV 90.9 78.0 - 100.0 fL   MCH 30.6 26.0 - 34.0 pg   MCHC 33.6 30.0 - 36.0 g/dL  RDW 12.1 11.5 - 15.5 %   Platelets 216 150 - 400 K/uL  Comprehensive metabolic panel     Status: Abnormal   Collection Time: 07/11/15  3:04 PM  Result Value Ref Range   Sodium 132 (L) 135 - 145 mmol/L   Potassium 3.7 3.5 - 5.1 mmol/L   Chloride 102 101 - 111 mmol/L   CO2 22 22 - 32 mmol/L   Glucose, Bld 77 65 - 99 mg/dL   BUN 7 6 - 20 mg/dL   Creatinine, Ser 0.44 0.44 - 1.00 mg/dL   Calcium 8.5 (L) 8.9 - 10.3 mg/dL   Total Protein 6.9 6.5 - 8.1 g/dL   Albumin 3.3 (L) 3.5 - 5.0 g/dL   AST 19 15 - 41 U/L   ALT 14 14 - 54 U/L   Alkaline Phosphatase 52 38 - 126 U/L   Total Bilirubin 0.4 0.3 - 1.2 mg/dL   GFR calc non Af Amer >60 >60 mL/min   GFR calc Af Amer >60 >60 mL/min    Comment: (NOTE) The eGFR has been calculated using the CKD EPI equation. This calculation has not been validated in all clinical situations. eGFR's persistently <60  mL/min signify possible Chronic Kidney Disease.    Anion gap 8 5 - 15    Review of Systems  Constitutional: Positive for fever and chills.  Eyes: Positive for photophobia.  Gastrointestinal: Negative for nausea.  Genitourinary: Negative for dysuria and urgency.  Musculoskeletal: Positive for back pain (+ lower back pain both sides. ).  Neurological: Positive for headaches.   Physical Exam   Blood pressure 106/57, pulse 102, temperature 98.5 F (36.9 C), temperature source Oral, resp. rate 16, height '5\' 1"'  (1.549 m), weight 48.535 kg (107 lb), last menstrual period 03/11/2015, currently breastfeeding.  Physical Exam  Constitutional: She is oriented to person, place, and time. She appears well-developed and well-nourished. No distress.  HENT:  Head: Normocephalic.  Eyes: Pupils are equal, round, and reactive to light.  Neck: Neck supple.  Musculoskeletal: Normal range of motion.  Neurological: She is alert and oriented to person, place, and time. GCS eye subscore is 4. GCS verbal subscore is 5. GCS motor subscore is 6.  Skin: Skin is warm. She is not diaphoretic.  Psychiatric: Her behavior is normal.    MAU Course  Procedures  None  MDM  + fetal heart tones by doppler CBC Urine shows mild dehydration  LR bolus given  Headache cocktail > decadron>phenergan>benadryl. Patient rates her pain 0/10 at the time of discharge.   Temp recheck 98.5  Assessment and Plan   A:  1. Headache in pregnancy, second trimester    P:  Discharge home in stable condition Return to MAU if symptoms worse RX: Fioricet  Keep next scheduled appointment.  Ok to use tylenol as directed on the bottle.   Lezlie Lye, NP 07/13/2015 10:10 AM

## 2015-07-14 ENCOUNTER — Encounter (HOSPITAL_COMMUNITY): Payer: Self-pay | Admitting: *Deleted

## 2015-07-14 ENCOUNTER — Inpatient Hospital Stay (HOSPITAL_COMMUNITY)
Admission: AD | Admit: 2015-07-14 | Discharge: 2015-07-14 | Disposition: A | Discharge status: Home / Self Care | Payer: Medicaid Other | Source: Ambulatory Visit | Attending: Obstetrics & Gynecology | Admitting: Obstetrics & Gynecology

## 2015-07-14 DIAGNOSIS — R42 Dizziness and giddiness: Secondary | ICD-10-CM | POA: Diagnosis not present

## 2015-07-14 DIAGNOSIS — Z3A17 17 weeks gestation of pregnancy: Secondary | ICD-10-CM | POA: Diagnosis not present

## 2015-07-14 DIAGNOSIS — G43809 Other migraine, not intractable, without status migrainosus: Secondary | ICD-10-CM

## 2015-07-14 DIAGNOSIS — O26892 Other specified pregnancy related conditions, second trimester: Secondary | ICD-10-CM | POA: Diagnosis present

## 2015-07-14 DIAGNOSIS — R51 Headache: Secondary | ICD-10-CM | POA: Insufficient documentation

## 2015-07-14 LAB — URINALYSIS, ROUTINE W REFLEX MICROSCOPIC
BILIRUBIN URINE: NEGATIVE
Glucose, UA: NEGATIVE mg/dL
HGB URINE DIPSTICK: NEGATIVE
Ketones, ur: NEGATIVE mg/dL
Leukocytes, UA: NEGATIVE
NITRITE: NEGATIVE
PROTEIN: NEGATIVE mg/dL
Specific Gravity, Urine: 1.01 (ref 1.005–1.030)
UROBILINOGEN UA: 0.2 mg/dL (ref 0.0–1.0)
pH: 7 (ref 5.0–8.0)

## 2015-07-14 MED ORDER — OXYCODONE-ACETAMINOPHEN 5-325 MG PO TABS: 1.0000 | ORAL_TABLET | Freq: Once | ORAL | Status: DC

## 2015-07-14 MED ORDER — METOCLOPRAMIDE HCL 10 MG PO TABS: 10.0000 mg | ORAL_TABLET | Freq: Four times a day (QID) | ORAL | Status: DC | PRN

## 2015-07-14 NOTE — MAU Note (Signed)
Patient presents at [redacted] weeks gestation with c/o headache and dizziness since Thursday. Has felt fetal movement today. Denies bleeding or discharge.

## 2015-07-14 NOTE — MAU Provider Note (Signed)
MAU HISTORY AND PHYSICAL  Chief Complaint:  Headache and Dizziness   Carla Haley is a 24 y.o.  G2P1001 with IUP at [redacted]w[redacted]d presenting for Headache and Dizziness  New problem, no prior headaches. Headaches began 2 weeks ago. Intermittent. Right-sided. Pounding. Sensitive to the light. Seen in our mau for this 3 days ago, prescribed fioricet, doesn't think it helped. Has felt nauseaus, vomited once.   Seen in this mau for headache, prescribed fioricet. Took that this morning.  Current headache is improving.  No fevers or chills or neck pain or stiffness.   No RUQ pain, no trouble breathing. No vision change.  Past Medical History  Diagnosis Date  . Medical history non-contributory     Past Surgical History  Procedure Laterality Date  . No past surgeries      History reviewed. No pertinent family history.  Social History  Substance Use Topics  . Smoking status: Never Smoker   . Smokeless tobacco: None  . Alcohol Use: No    No Known Allergies  Prescriptions prior to admission  Medication Sig Dispense Refill Last Dose  . aspirin EC 325 MG tablet Take 325 mg by mouth daily.   07/14/2015 at Unknown time  . butalbital-acetaminophen-caffeine (FIORICET) 50-325-40 MG per tablet Take 1-2 tablets by mouth every 6 (six) hours as needed for headache. 10 tablet 0 07/14/2015 at Unknown time  . Prenatal Vit-Fe Fumarate-FA (PRENATAL MULTIVITAMIN) TABS tablet Take 1 tablet by mouth every 7 (seven) days. Take only once a week per patient   Past Week at Unknown time  . ibuprofen (ADVIL,MOTRIN) 600 MG tablet Take 1 tablet (600 mg total) by mouth every 6 (six) hours. (Patient not taking: Reported on 04/15/2015) 30 tablet 0 Not Taking at Unknown time  . oxyCODONE-acetaminophen (PERCOCET/ROXICET) 5-325 MG per tablet Take 1 tablet by mouth every 4 (four) hours as needed (for pain scale less than 7). (Patient not taking: Reported on 04/15/2015) 30 tablet 0 Not Taking at Unknown time  . sertraline  (ZOLOFT) 50 MG tablet Take 1 tablet (50 mg total) by mouth daily. (Patient not taking: Reported on 04/15/2015) 30 tablet 3 Not Taking at Unknown time    Review of Systems - Negative except for what is mentioned in HPI.  Physical Exam  Blood pressure 94/55, pulse 89, temperature 98.9 F (37.2 C), temperature source Oral, resp. rate 20, height  (1.549 m), weight 107 lb (48.535 kg), last menstrual period 03/11/2015, currently breastfeeding. GENERAL: Well-developed, well-nourished female in no acute distress.  LUNGS: Clear to auscultation bilaterally.  HEART: Regular rate and rhythm. ABDOMEN: Soft, nontender, nondistended, gravid.  EXTREMITIES: Nontender, no edema, 2+ distal pulses. Neuro: cn2-12 grossly intact, 5/5 upper and lower strength, distal sensation to light touch intact, 2+ patellar and biceps reflexes bilaterally, normal finger-to-nose. No dysarthria.  FHTs: 145 bpm   Labs: No results found for this or any previous visit (from the past 24 hour(s)).  Imaging Studies:  No results found.  Assessment: Carla Haley is  24 y.o. G2P1001 at [redacted]w[redacted]d presents with headache. This appears to be migranous. No red flag symptoms to warrant neuroimaging. Is at a gestational age wherein preeclampsia is very unlikely, and BP is not elevated.  Plan: - stop fioricet; prescribing percocet and reglan, with appropriate precautions - hydrate - headache red flag return precautions - close OB f/u  Cherrie Gauze [facility] 9/11/20162:02 PM

## 2015-07-14 NOTE — Discharge Instructions (Signed)

## 2015-07-18 ENCOUNTER — Other Ambulatory Visit (HOSPITAL_COMMUNITY): Payer: Self-pay | Admitting: Nurse Practitioner

## 2015-07-18 DIAGNOSIS — Z3689 Encounter for other specified antenatal screening: Secondary | ICD-10-CM

## 2015-07-18 DIAGNOSIS — Z3A20 20 weeks gestation of pregnancy: Secondary | ICD-10-CM

## 2015-07-30 ENCOUNTER — Ambulatory Visit (HOSPITAL_COMMUNITY)
Admission: RE | Admit: 2015-07-30 | Discharge: 2015-07-30 | Disposition: A | Discharge status: Home / Self Care | Payer: Medicaid Other | Source: Ambulatory Visit | Attending: Nurse Practitioner | Admitting: Nurse Practitioner

## 2015-07-30 ENCOUNTER — Other Ambulatory Visit (HOSPITAL_COMMUNITY): Payer: Self-pay | Admitting: Nurse Practitioner

## 2015-07-30 DIAGNOSIS — Z3A20 20 weeks gestation of pregnancy: Secondary | ICD-10-CM | POA: Insufficient documentation

## 2015-07-30 DIAGNOSIS — Z3689 Encounter for other specified antenatal screening: Secondary | ICD-10-CM

## 2015-07-30 DIAGNOSIS — O09892 Supervision of other high risk pregnancies, second trimester: Secondary | ICD-10-CM | POA: Insufficient documentation

## 2015-07-30 DIAGNOSIS — Z36 Encounter for antenatal screening of mother: Secondary | ICD-10-CM | POA: Insufficient documentation

## 2015-11-03 NOTE — L&D Delivery Note (Signed)
Delivery Note Pt pushed x 1, bbow srom'd w/ clear fluid, pushed x 1 more, and at 10:40 AM a viable female was delivered via Vaginal, Spontaneous Delivery (Presentation: Right Occiput Anterior). Tight nuchal unable to reduce, delivered through and reduced immediately after birth. APGAR: 8, 9; weight: pending at time of note. Infant placed directly on mom's abdomen for bonding/skin-to-skin. Delayed cord clamping, then cord clamped x 2, and cut by fob.     Placenta status: delivered spontaneously intact.  Cord: 3 vessels with the following complications: None.    Anesthesia: Epidural  Episiotomy: n/a  Lacerations:  none Suture Repair: n/a Est. Blood Loss (mL):   Mom to postpartum.  Baby to Couplet care / Skin to Skin. Plans to breastfeed, mirena for contraception  Marge Duncans 12/07/2015, 11:01 AM

## 2015-11-20 LAB — OB RESULTS CONSOLE GC/CHLAMYDIA
Chlamydia: NEGATIVE
Gonorrhea: NEGATIVE

## 2015-11-20 LAB — OB RESULTS CONSOLE GBS: STREP GROUP B AG: NEGATIVE

## 2015-12-07 ENCOUNTER — Encounter (HOSPITAL_COMMUNITY): Payer: Self-pay | Admitting: *Deleted

## 2015-12-07 ENCOUNTER — Inpatient Hospital Stay (HOSPITAL_COMMUNITY)
Admission: AD | Admit: 2015-12-07 | Discharge: 2015-12-08 | DRG: 775 | Disposition: A | Discharge status: Home / Self Care | Payer: Medicaid Other | Source: Ambulatory Visit | Attending: Obstetrics & Gynecology | Admitting: Obstetrics & Gynecology

## 2015-12-07 ENCOUNTER — Inpatient Hospital Stay (HOSPITAL_COMMUNITY): Payer: Medicaid Other | Admitting: Anesthesiology

## 2015-12-07 DIAGNOSIS — Z3A38 38 weeks gestation of pregnancy: Secondary | ICD-10-CM

## 2015-12-07 LAB — CBC
HEMATOCRIT: 34.4 % — AB (ref 36.0–46.0)
Hemoglobin: 11.8 g/dL — ABNORMAL LOW (ref 12.0–15.0)
MCH: 31 pg (ref 26.0–34.0)
MCHC: 34.3 g/dL (ref 30.0–36.0)
MCV: 90.3 fL (ref 78.0–100.0)
Platelets: 224 10*3/uL (ref 150–400)
RBC: 3.81 MIL/uL — ABNORMAL LOW (ref 3.87–5.11)
RDW: 12.9 % (ref 11.5–15.5)
WBC: 7.4 10*3/uL (ref 4.0–10.5)

## 2015-12-07 LAB — TYPE AND SCREEN
ABO/RH(D): A POS
ANTIBODY SCREEN: NEGATIVE

## 2015-12-07 LAB — RPR: RPR Ser Ql: NONREACTIVE

## 2015-12-07 MED ORDER — OXYCODONE-ACETAMINOPHEN 5-325 MG PO TABS: 1.0000 | ORAL_TABLET | ORAL | Status: DC | PRN

## 2015-12-07 MED ORDER — MEASLES, MUMPS & RUBELLA VAC ~~LOC~~ INJ
0.5000 mL | INJECTION | Freq: Once | SUBCUTANEOUS | Status: DC
  Filled 2015-12-07: qty 0.5

## 2015-12-07 MED ORDER — BISACODYL 10 MG RE SUPP: 10.0000 mg | Freq: Every day | RECTAL | Status: DC | PRN

## 2015-12-07 MED ORDER — SENNOSIDES-DOCUSATE SODIUM 8.6-50 MG PO TABS
2.0000 | ORAL_TABLET | ORAL | Status: DC
  Administered 2015-12-08: 2 via ORAL
  Filled 2015-12-07: qty 2

## 2015-12-07 MED ORDER — FLEET ENEMA 7-19 GM/118ML RE ENEM: 1.0000 | ENEMA | RECTAL | Status: DC | PRN

## 2015-12-07 MED ORDER — ZOLPIDEM TARTRATE 5 MG PO TABS: 5.0000 mg | ORAL_TABLET | Freq: Every evening | ORAL | Status: DC | PRN

## 2015-12-07 MED ORDER — LANOLIN HYDROUS EX OINT: TOPICAL_OINTMENT | CUTANEOUS | Status: DC | PRN

## 2015-12-07 MED ORDER — PHENYLEPHRINE 40 MCG/ML (10ML) SYRINGE FOR IV PUSH (FOR BLOOD PRESSURE SUPPORT)
80.0000 ug | PREFILLED_SYRINGE | INTRAVENOUS | Status: DC | PRN
  Filled 2015-12-07: qty 2
  Filled 2015-12-07: qty 20

## 2015-12-07 MED ORDER — DIBUCAINE 1 % RE OINT: 1.0000 "application " | TOPICAL_OINTMENT | RECTAL | Status: DC | PRN

## 2015-12-07 MED ORDER — CITRIC ACID-SODIUM CITRATE 334-500 MG/5ML PO SOLN: 30.0000 mL | ORAL | Status: DC | PRN

## 2015-12-07 MED ORDER — ONDANSETRON HCL 4 MG/2ML IJ SOLN
4.0000 mg | Freq: Four times a day (QID) | INTRAMUSCULAR | Status: DC | PRN
  Administered 2015-12-07: 4 mg via INTRAVENOUS
  Filled 2015-12-07 (×2): qty 2

## 2015-12-07 MED ORDER — BENZOCAINE-MENTHOL 20-0.5 % EX AERO: 1.0000 "application " | INHALATION_SPRAY | CUTANEOUS | Status: DC | PRN

## 2015-12-07 MED ORDER — ONDANSETRON HCL 4 MG PO TABS: 4.0000 mg | ORAL_TABLET | ORAL | Status: DC | PRN

## 2015-12-07 MED ORDER — SIMETHICONE 80 MG PO CHEW
80.0000 mg | CHEWABLE_TABLET | ORAL | Status: DC | PRN
  Filled 2015-12-07: qty 1

## 2015-12-07 MED ORDER — SODIUM CHLORIDE 0.9% FLUSH: 3.0000 mL | Freq: Two times a day (BID) | INTRAVENOUS | Status: DC

## 2015-12-07 MED ORDER — LACTATED RINGERS IV SOLN
500.0000 mL | INTRAVENOUS | Status: DC | PRN
  Administered 2015-12-07: 500 mL via INTRAVENOUS
  Administered 2015-12-07: 1000 mL via INTRAVENOUS

## 2015-12-07 MED ORDER — LIDOCAINE HCL (PF) 1 % IJ SOLN
30.0000 mL | INTRAMUSCULAR | Status: DC | PRN
  Filled 2015-12-07: qty 30

## 2015-12-07 MED ORDER — ACETAMINOPHEN 325 MG PO TABS: 650.0000 mg | ORAL_TABLET | ORAL | Status: DC | PRN

## 2015-12-07 MED ORDER — LACTATED RINGERS IV SOLN
2.5000 [IU]/h | INTRAVENOUS | Status: DC
  Filled 2015-12-07: qty 4

## 2015-12-07 MED ORDER — FLEET ENEMA 7-19 GM/118ML RE ENEM: 1.0000 | ENEMA | Freq: Every day | RECTAL | Status: DC | PRN

## 2015-12-07 MED ORDER — ONDANSETRON HCL 4 MG/2ML IJ SOLN: 4.0000 mg | INTRAMUSCULAR | Status: DC | PRN

## 2015-12-07 MED ORDER — WITCH HAZEL-GLYCERIN EX PADS: 1.0000 "application " | MEDICATED_PAD | CUTANEOUS | Status: DC | PRN

## 2015-12-07 MED ORDER — IBUPROFEN 600 MG PO TABS
600.0000 mg | ORAL_TABLET | Freq: Four times a day (QID) | ORAL | Status: DC
  Administered 2015-12-07 – 2015-12-08 (×4): 600 mg via ORAL
  Filled 2015-12-07 (×4): qty 1

## 2015-12-07 MED ORDER — DIPHENHYDRAMINE HCL 25 MG PO CAPS: 25.0000 mg | ORAL_CAPSULE | Freq: Four times a day (QID) | ORAL | Status: DC | PRN

## 2015-12-07 MED ORDER — LACTATED RINGERS IV SOLN
INTRAVENOUS | Status: DC
  Administered 2015-12-07 (×2): via INTRAVENOUS

## 2015-12-07 MED ORDER — TETANUS-DIPHTH-ACELL PERTUSSIS 5-2.5-18.5 LF-MCG/0.5 IM SUSP
0.5000 mL | Freq: Once | INTRAMUSCULAR | Status: DC
Start: 2015-12-08 — End: 2015-12-08

## 2015-12-07 MED ORDER — SODIUM CHLORIDE 0.9% FLUSH: 3.0000 mL | INTRAVENOUS | Status: DC | PRN

## 2015-12-07 MED ORDER — EPHEDRINE 5 MG/ML INJ
10.0000 mg | INTRAVENOUS | Status: DC | PRN
  Filled 2015-12-07: qty 2

## 2015-12-07 MED ORDER — LIDOCAINE HCL (PF) 1 % IJ SOLN
INTRAMUSCULAR | Status: DC | PRN
  Administered 2015-12-07: 2 mL
  Administered 2015-12-07: 3 mL
  Administered 2015-12-07: 5 mL

## 2015-12-07 MED ORDER — FENTANYL 2.5 MCG/ML BUPIVACAINE 1/10 % EPIDURAL INFUSION (WH - ANES)
14.0000 mL/h | INTRAMUSCULAR | Status: DC | PRN
  Administered 2015-12-07: 14 mL/h via EPIDURAL
  Filled 2015-12-07: qty 125

## 2015-12-07 MED ORDER — SODIUM CHLORIDE 0.9 % IV SOLN: 250.0000 mL | INTRAVENOUS | Status: DC | PRN

## 2015-12-07 MED ORDER — OXYCODONE-ACETAMINOPHEN 5-325 MG PO TABS: 2.0000 | ORAL_TABLET | ORAL | Status: DC | PRN

## 2015-12-07 MED ORDER — OXYTOCIN BOLUS FROM INFUSION
500.0000 mL | INTRAVENOUS | Status: DC
  Administered 2015-12-07: 500 mL via INTRAVENOUS

## 2015-12-07 MED ORDER — DIPHENHYDRAMINE HCL 50 MG/ML IJ SOLN: 12.5000 mg | INTRAMUSCULAR | Status: DC | PRN

## 2015-12-07 MED ORDER — PRENATAL MULTIVITAMIN CH
1.0000 | ORAL_TABLET | Freq: Every day | ORAL | Status: DC
  Administered 2015-12-08: 1 via ORAL
  Filled 2015-12-07: qty 1

## 2015-12-07 NOTE — H&P (Signed)
Carla Haley is a 25 y.o. female G2P1001 @ 38.5wks by LMP and confirmed by 13wk scan presenting for active labor. Denies leaking or bldg. Her preg has been followed by the [facility] and has been remarkable for 1) PPD 2) GBS neg.  History OB History    Gravida Para Term Preterm AB TAB SAB Ectopic Multiple Living   0 0 0 0 0 0 1     Past Medical History  Diagnosis Date  . Medical history non-contributory    Past Surgical History  Procedure Laterality Date  . No past surgeries     Family History: family history is not on file. Social History:  reports that she has never smoked. She does not have any smokeless tobacco history on file. She reports that she does not drink alcohol or use illicit drugs.   Prenatal Transfer Tool  Maternal Diabetes: No Genetic Screening: Normal Maternal Ultrasounds/Referrals: Normal Fetal Ultrasounds or other Referrals:  None Maternal Substance Abuse:  No Significant Maternal Medications:  None Significant Maternal Lab Results:  Lab values include: Group B Strep negative Other Comments:  None  ROS  Cx 5/80/BBOW/vtx -1 by MAU RN  Blood pressure 99/61, pulse 82, temperature 97.7 F (36.5 C), temperature source Oral, resp. rate 17, height  (1.6 m), weight 54.432 kg (120 lb), last menstrual period 03/11/2015, SpO2 99 %, currently breastfeeding. Exam Physical Exam  Constitutional: She is oriented to person, place, and time. She appears well-developed.  HENT:  Head: Normocephalic.  Neck: Normal range of motion.  Cardiovascular: Normal rate.   Respiratory: Effort normal.  GI:  EFM +accels, no decels, Cat 1 Ctx q 3-4 mins  Musculoskeletal: Normal range of motion.  Neurological: She is alert and oriented to person, place, and time.  Skin: Skin is warm and dry.  Psychiatric: She has a normal mood and affect. Her behavior is normal. Thought content normal.    Prenatal labs: ABO, Rh: --/--/A POS (02/04 0550) Antibody: NEG (02/04  0550) Rubella: Immune (07/21 0000) RPR: Nonreactive (07/21 0000)  HBsAg: Negative (07/21 0000)  HIV: Non-reactive (07/21 0000)  GBS: Negative (01/18 0000)   Assessment/Plan: IUP@term  Early active labor GBS neg  Admit to [facility] Plans epidural for pain management Anticipate SVD   Chukwuemeka Artola CNM 12/07/2015, 9:01 AM

## 2015-12-07 NOTE — Anesthesia Preprocedure Evaluation (Signed)
Anesthesia Evaluation  Patient identified by MRN, date of birth, ID band Patient awake    Reviewed: Allergy & Precautions, Patient's Chart, lab work & pertinent test results  Airway Mallampati: II  TM Distance: >3 FB     Dental   Pulmonary neg pulmonary ROS,    breath sounds clear to auscultation       Cardiovascular negative cardio ROS   Rhythm:Regular Rate:Normal     Neuro/Psych negative neurological ROS     GI/Hepatic negative GI ROS, Neg liver ROS,   Endo/Other  negative endocrine ROS  Renal/GU negative Renal ROS     Musculoskeletal   Abdominal   Peds  Hematology negative hematology ROS (+)   Anesthesia Other Findings   Reproductive/Obstetrics (+) Pregnancy                             Lab Results  Component Value Date   WBC 7.4 12/07/2015   HGB 11.8* 12/07/2015   HCT 34.4* 12/07/2015   MCV 90.3 12/07/2015   PLT 224 12/07/2015    Anesthesia Physical Anesthesia Plan  ASA: II  Anesthesia Plan: Epidural   Post-op Pain Management:    Induction:   Airway Management Planned:   Additional Equipment:   Intra-op Plan:   Post-operative Plan:   Informed Consent: I have reviewed the patients History and Physical, chart, labs and discussed the procedure including the risks, benefits and alternatives for the proposed anesthesia with the patient or authorized representative who has indicated his/her understanding and acceptance.     Plan Discussed with:   Anesthesia Plan Comments:         Anesthesia Quick Evaluation

## 2015-12-07 NOTE — MAU Note (Signed)
ctxs since 0300. Denies LOF or bleeding

## 2015-12-07 NOTE — Anesthesia Procedure Notes (Signed)
Epidural Patient location during procedure: OB  Staffing Anesthesiologist: Renly Roots Performed by: anesthesiologist   Preanesthetic Checklist Completed: patient identified, site marked, surgical consent, pre-op evaluation, timeout performed, IV checked, risks and benefits discussed and monitors and equipment checked  Epidural Patient position: sitting Prep: site prepped and draped and DuraPrep Patient monitoring: continuous pulse ox and blood pressure Approach: midline Location: L4-L5 Injection technique: LOR saline  Needle:  Needle type: Tuohy  Needle gauge: 17 G Needle length: 9 cm and 9 Needle insertion depth: 5 cm cm Catheter type: closed end flexible Catheter size: 19 Gauge Catheter at skin depth: 10 cm Test dose: negative  Assessment Events: blood not aspirated, injection not painful, no injection resistance, negative IV test and no paresthesia   

## 2015-12-07 NOTE — Lactation Note (Addendum)
This note was copied from the chart of Carla Haley. Lactation Consultation Note  Patient Name: Carla Haley Today's Date: 12/07/2015 Reason for consult: Initial assessment   Initial consult with Exp BF mom of 8 hour old infant. Infant with 2 BF for 25 and 30 minutes, 0 voids and 0 stools since birth. Mom reports that she still has some milk from nursing 86 months old. Infant currently asleep in crib and has not fed since 2 pm, mom reports she has not tried to feed infant since 2 as infant was sleeping. Mom reports she has been sleeping. Enc her to awaken infant to feed. Mom to eat dinner and then awaken infant to feed. Enc mom to feed 8-12 x in 24 hours at first feeding cues and to awaken infant as needed to feed. Discussed cluster feeding. Mom with out BF questions at this time. Gave mom BF Resources handout. Mom is a [facility] client and is to call and make an appointment after d/c. Mom does not have a breast pump at home. [facility] Brochure given, informed mom of OP Services, BF Support Groups and phone #. Enc mom to call with questions/concerns as needed. Will follow up tomorrow.   Maternal Data Formula Feeding for Exclusion: No Does the patient have breastfeeding experience prior to this delivery?: Yes  Feeding    LATCH Score/Interventions                      Lactation Tools Discussed/Used WIC Program: Yes   Consult Status Consult Status: Follow-up Date: 12/08/15 Follow-up type: In-patient    Silas Flood Hice 12/07/2015, 7:12 PM

## 2015-12-07 NOTE — Progress Notes (Signed)
Philipp Deputy CNM notified of pt's admission and status. Aware of ctx pattern, sve, reassuring FHR but not reactive yet. Will admit to [facility]

## 2015-12-08 MED ORDER — PRENATAL VITAMINS 28-0.8 MG PO TABS: 1.0000 | ORAL_TABLET | Freq: Every day | ORAL | Status: DC

## 2015-12-08 MED ORDER — ACETAMINOPHEN 325 MG PO TABS: 650.0000 mg | ORAL_TABLET | ORAL | Status: DC | PRN

## 2015-12-08 MED ORDER — BISACODYL 10 MG RE SUPP: 10.0000 mg | Freq: Every day | RECTAL | Status: DC | PRN

## 2015-12-08 MED ORDER — IBUPROFEN 600 MG PO TABS: 600.0000 mg | ORAL_TABLET | Freq: Four times a day (QID) | ORAL | Status: DC

## 2015-12-08 NOTE — Discharge Summary (Signed)
OB Discharge Summary     Patient Name: Carla Haley DOB: 1991/07/26 MRN: [id number]  Date of admission: 12/07/2015 Delivering MD: Shawna Clamp R   Date of discharge: 12/08/2015  Admitting diagnosis: 39wks, contractions Intrauterine pregnancy: [redacted]w[redacted]d     Secondary diagnosis:  Active Problems:   Indication for care in labor or delivery  Additional problems:  +PPD GBS neg     Discharge diagnosis: Term Pregnancy Delivered                                                                                                Post partum procedures:none  Augmentation: none  Complications: None  Hospital course:  Onset of Labor With Vaginal Delivery     25 y.o. yo [id number] at [redacted]w[redacted]d was admitted in Active Labor on 12/07/2015. Patient had an uncomplicated labor course as follows:  Membrane Rupture Time/Date: 10:40 AM ,12/07/2015   Intrapartum Procedures: Episiotomy: None [1]                                         Lacerations:  None [1]  Patient had a delivery of a Viable infant. 12/07/2015  Information for the patient's newborn:  Khamil, Lamica Girl Bevan [[id number]]  Delivery Method: Vaginal, Spontaneous Delivery (Filed from Delivery Summary)    Pateint had an uncomplicated postpartum course.  She is ambulating, tolerating a regular diet, passing flatus, and urinating well. Patient is discharged home in stable condition on 12/08/2015.    Physical exam  Filed Vitals:   12/07/15 1335 12/07/15 1701 12/07/15 2355 12/08/15 0546  BP: 98/56 103/61 106/61 98/64  Pulse: 95 97 79 72  Temp: 98.1 F (36.7 C) 98.7 F (37.1 C) 98.3 F (36.8 C) 98.1 F (36.7 C)  TempSrc: Axillary Oral Oral Oral  Resp: Height:      Weight:      SpO2: 99% 99%     General: alert, cooperative and no distress Lochia: appropriate Uterine Fundus: firm Incision: N/A DVT Evaluation: No evidence of DVT seen on physical exam. Labs: Lab Results  Component Value Date   WBC 7.4 12/07/2015   HGB 11.8*  12/07/2015   HCT 34.4* 12/07/2015   MCV 90.3 12/07/2015   PLT 224 12/07/2015   CMP Latest Ref Rng 07/11/2015  Glucose 65 - 99 mg/dL 77  BUN 6 - 20 mg/dL 7  Creatinine 6.96 - 2.95 mg/dL 2.84  Sodium 132 - 440 mmol/L 132(L)  Potassium 3.5 - 5.1 mmol/L 3.7  Chloride 101 - 111 mmol/L 102  CO2 22 - 32 mmol/L 22  Calcium 8.9 - 10.3 mg/dL 1.0(U)  Total Protein 6.5 - 8.1 g/dL 6.9  Total Bilirubin 0.3 - 1.2 mg/dL 0.4  Alkaline Phos 38 - 126 U/L 52  AST 15 - 41 U/L 19  ALT 14 - 54 U/L 14    Discharge instruction: per After Visit Summary and "Baby and Me Booklet".  After visit meds:    Medication List    STOP taking these  medications        oxyCODONE-acetaminophen 5-325 MG tablet  Commonly known as:  PERCOCET/ROXICET      TAKE these medications        acetaminophen 325 MG tablet  Commonly known as:  TYLENOL  Take 2 tablets (650 mg total) by mouth every 4 (four) hours as needed (for pain scale < 4).     bisacodyl 10 MG suppository  Commonly known as:  DULCOLAX  Place 1 suppository (10 mg total) rectally daily as needed for moderate constipation.     ibuprofen 600 MG tablet  Commonly known as:  ADVIL,MOTRIN  Take 1 tablet (600 mg total) by mouth every 6 (six) hours.     metoCLOPramide 10 MG tablet  Commonly known as:  REGLAN  Take 1 tablet (10 mg total) by mouth 4 (four) times daily as needed for nausea or vomiting (or headache).     Prenatal Vitamins 28-0.8 MG Tabs  Take 1 tablet by mouth daily.        Diet: routine diet  Activity: Advance as tolerated. Pelvic rest for 6 weeks.   Outpatient follow up:6 weeks Follow up Appt:No future appointments. Follow up Visit:No Follow-up on file. Follow-up Information    Follow up with [facility]. Schedule an appointment as soon as possible for a visit in 6 weeks.   Why:  post partum check   Contact information:   [address] [place] [address] [phone]       Postpartum contraception: IUD  Mirena  Newborn Data: Live born female  Birth Weight: 7 lb 0.9 oz (3200 g) APGAR: , 9  Baby Feeding: Breast Disposition:home with mother   12/08/2015 Wynne Dust, MD  OB FELLOW DISCHARGE ATTESTATION  I have seen and examined this patient and agree with above documentation in the resident's note.   Silvano Bilis, MD 9:26 AM

## 2015-12-08 NOTE — Anesthesia Postprocedure Evaluation (Signed)
Anesthesia Post Note  Patient: Carla Haley  Procedure(s) Performed: * No procedures listed *  Patient location during evaluation: Mother Baby Anesthesia Type: Epidural Level of consciousness: awake and alert Pain management: pain level controlled Vital Signs Assessment: post-procedure vital signs reviewed and stable Respiratory status: spontaneous breathing Cardiovascular status: stable Postop Assessment: no signs of nausea or vomiting, adequate PO intake, no headache and no backache Anesthetic complications: no    Last Vitals:  Filed Vitals:   12/07/15 2355 12/08/15 0546  BP: 106/61 98/64  Pulse: 79 72  Temp: 36.8 C 36.7 C  Resp: 18 18    Last Pain:  Filed Vitals:   12/08/15 0609  PainSc: 3                  Geordie Nooney Hristova

## 2015-12-08 NOTE — Discharge Instructions (Signed)

## 2015-12-08 NOTE — Lactation Note (Addendum)
This note was copied from the chart of Carla Maryjayne Ozburn. Lactation Consultation Note  Patient Name: Carla Haley [id number] Date: 12/08/2015 Reason for consult: Follow-up assessment;Other (Comment) (2 % weight loss , baby having a hearing screen , see LC note, LC updated doc flow sheet )  Baby 28 hours old, has been to the breast consistently .  After hearing screen , baby awake and fussy , large wet diaper and small stool ( mec.) ( LC changed diaper). LC placed baby skin to skin with mom on the right breast in football position. Baby latched with depth . At 1st upper lip rolled under, LC flipped to flanged position.  Multiply swallows, increased with breast compressions. Per mom small blister noted on the right nipple ( intact) . Sore nipple and engorgement prevention and tx reviewed.  LC instructed mom on the use hand pump. Per mom used the #24 Flange with her 1st baby.  Baby has been feeding 17  mins and still feeding. ( Latch score 9 ) MBU RN Kristine Linea aware the baby is still feeding and doc flow sheets need to be undated.  Mother informed of post-discharge support and given phone number to the lactation department, including services for phone call assistance; out-patient appointments; and breastfeeding support group. List of other breastfeeding resources in the community given in the handout. Encouraged mother to call for problems or concerns related to breastfeeding.   Maternal Data Has patient been taught Hand Expression?: Yes  Feeding   LATCH Score/Interventions                      Lactation Tools Discussed/Used Tools: Pump (per mom used the #24 Flange with 1st baby ) Breast pump type: Manual   Consult Status Consult Status: Follow-up Date: 12/08/15 Follow-up type: In-patient    Kathrin Greathouse 12/08/2015, 3:11 PM

## 2015-12-08 NOTE — Progress Notes (Addendum)
This Rn reviewed the importance of head lifts, pelvic tilts, Kegel's, and walking are in helping to correct diastasis (her abdominal muscles are approximately 2+ inches apart) and pelvic floor strength.  Addendum:  Discharge teaching completed, however this RN needed to provide all paperwork which is given at admission as none of these handouts and no book was in the room and patient states she did not receive.

## 2015-12-08 NOTE — Progress Notes (Signed)
CLINICAL SOCIAL WORK MATERNAL/CHILD NOTE  Patient Details  Name: Carla Haley MRN: [id number] Date of Birth: 12/07/2014  Date: 12/08/2015  Clinical Social Worker Initiating Note: Ailana Cuadrado, LCSWDate/ Time Initiated: 12/08/15/1400   Child's Name: Carla Haley   Legal Guardian:  (Parents [name] Mushunju and Lastacia Hoaglin)   Need for Interpreter: None   Date of Referral: 12/07/15   Reason for Referral:     Referral Source: Central Nurs[facility]   Address: [place], [place] [address]  Phone number:  [phone])   Household Members: Spouse, Minor Children   Natural Supports (not living in the home): Extended Family, Armed forces training and education officer Supports:None   Employment: (Spouse is employed)   Type of Work:     Education:     Pensions consultant:    Other Resources: Physicist, medical , [facility]   Cultural/Religious Considerations Which May Impact Care:Parents are from the [place]  Strengths: Ability to meet basic needs , Home prepared for child , Pediatrician chosen    Risk Factors/Current Problems: None   Cognitive State: Alert , Able to Concentrate    Mood/Affect: Happy    CSW Assessment: Acknowledged order for social work consult to assess mother's hx of PP Depression. Met with mother who was pleasant and receptive to social work. Her husband and two friends were also present. Informed that she suffered from PP Depression after the birth of her first child. "We just moved here from the [place] leaving our families behind and I felt very alone and isolated being a new mother". Mother states that she never sought treatment, and the symptoms lasted for about a month. MOB states that things are different now. "We know many families in our community and several of my neighbors are from the [place]". She denies any current symptoms of depression or anxiety. She denies any hx of illicit drug use. No  acute social concerns noted or reported at this time. Mother informed of social work Fish farm manager.   CSW Plan/Description:    She's aware of signs/symptoms of PP Depression and available resources  No further intervention required  No barriers to discharge    Zahava Quant J, LCSW 12/08/2015, 3:16 PM

## 2015-12-10 NOTE — Progress Notes (Signed)
Post discharge chart review completed.  

## 2015-12-25 ENCOUNTER — Encounter (HOSPITAL_COMMUNITY): Payer: Self-pay | Admitting: *Deleted

## 2016-11-16 ENCOUNTER — Ambulatory Visit (INDEPENDENT_AMBULATORY_CARE_PROVIDER_SITE_OTHER): Payer: Self-pay | Admitting: Internal Medicine

## 2016-11-16 ENCOUNTER — Encounter: Payer: Self-pay | Admitting: Internal Medicine

## 2016-11-16 VITALS — BP 90/58 | HR 72 | Temp 98.5°F | Ht 63.0 in | Wt 106.8 lb

## 2016-11-16 DIAGNOSIS — R1013 Epigastric pain: Secondary | ICD-10-CM

## 2016-11-16 MED ORDER — FAMOTIDINE 20 MG PO TABS: 20.0000 mg | ORAL_TABLET | Freq: Two times a day (BID) | ORAL | 0 refills | Status: DC

## 2016-11-16 NOTE — Progress Notes (Signed)
[facility] Phone: [phone]  Subjective:  Smith RobertLaurette is a 26 year old female presenting to clinic with abdominal pain for the last 2 weeks. She states she is having both epigastric and lower abdominal pain. The epigastric pain is sharp. The epigastric pain is better with drinking milk and sometimes worse after eating. The lower abdominal pain is also sharp in nature. She is having regular bowel movements. Her last BM was this morning. No hard stools or straining. No dysuria or urinary frequency. No vaginal discharge. She endorses occasional nausea, but no vomiting. She denies hematochezia. She endorses a 10lb weight loss over the last year. She had a baby in 12/2015. Her pre-pregnancy weight was 116lb. She went up to 126lb while pregnant. She now weighs 106 lbs. She states she thinks this is because she is not eating as much as usual because she doesn't have an appetite. She currently has an IUD in place for birth control. She is sexually active with only her husband. She states that everything is going well at home with her husband and children. No feelings of sadness or depression.   ROS: See HPI for pertinent positives and negatives  Past Medical History- refugee, hx postpartum depression after her first baby  Family history reviewed for today's visit. No changes.  Social history- patient is a never smoker  Objective: BP (!) 90/58 (BP Location: Right Arm, Patient Position: Sitting, Cuff Size: Normal)   Pulse 72   Temp 98.5 F (36.9 C) (Oral)   Ht 5\' 3"  (1.6 m)   Wt 106 lb 12.8 oz (48.4 kg)   LMP 10/29/2016 (LMP Unknown)   SpO2 99%   Breastfeeding? Yes Comment: periodically  BMI 18.92 kg/m  Gen: NAD, alert, cooperative with exam CV: RRR, no murmur Resp: CTABL, no wheezes, normal work of breathing GI: +BS, soft, diffuse tenderness to palpation but pain is worse in the epigastric area and LLQ, no hepatomegaly, no tenderness over McBurney's point, Murphy's sign  negative, no rebound, no guarding Psych: Appropriate behavior, speaks quietly but makes good eye contact, normal rate of speech  Assessment/Plan: Abdominal Pain: Has been going on for 2 weeks. Pain is located in both the epigastric area and across her lower abdomen. Associated with nausea and decreased appetite. Broad differential for abdominal pain in a 26 year old female. Think the epigastric pain sounds like it could be GERD, as it is better when she drinks milk. No NSAID use to suggest PUD. She could also have gallstones, as her pain is sometimes worse after eating. Her lower abdominal pain may be secondary to constipation, although Pt states she is having regular bowel movements and does not have to strain. UTI is also a consideration, although Pt denies any urinary symptoms. STD/PID should also be considered, although Pt is sexually active with only her husband and denies any vaginal discharge. Her abdominal pain could also be related to stress, anxiety, depression. Pt does have a history of postpartum depression after her 1st baby. She denies any problems at home or feelings of depression. - Will prescribe PPI to see if this helps - Would have liked to get CBC, CMP, and H. Pylori today, but Pt refused because she only has family planning Medicaid and cannot afford it - Follow-up in 1 week to see how to the PPI is helping - If she is still having pain, may need to consider labs, UA, pelvic exam, abdominal US, PHQ-9 - Precepted with Dr. Erling CruzNeal   Krishawn Vanderweele  Jonia Oakey, MD PGY-2

## 2016-11-16 NOTE — Assessment & Plan Note (Signed)
Has been going on for 2 weeks. Pain is located in both the epigastric area and across her lower abdomen. Associated with nausea and decreased appetite. Broad differential for abdominal pain in a 26 year old female. Think the epigastric pain sounds like it could be GERD, as it is better when she drinks milk. No NSAID use to suggest PUD. She could also have gallstones, as her pain is sometimes worse after eating. Her lower abdominal pain may be secondary to constipation, although Pt states she is having regular bowel movements and does not have to strain. UTI is also a consideration, although Pt denies any urinary symptoms. STD/PID should also be considered, although Pt is sexually active with only her husband and denies any vaginal discharge. Her abdominal pain could also be related to stress, anxiety, depression. Pt does have a history of postpartum depression after her 1st baby. She denies any problems at home or feelings of depression. - Will prescribe PPI to see if this helps - Would have liked to get CBC, CMP, and H. Pylori today, but Pt refused because she only has family planning Medicaid and cannot afford it - Follow-up in 1 week to see how to the PPI is helping - If she is still having pain, may need to consider labs, UA, pelvic exam, abdominal US, PHQ-9 - Precepted with Dr. Jennette KettleNeal

## 2016-11-16 NOTE — Patient Instructions (Signed)
It was so nice to meet you!  I have ordered some labs today.   I have also prescribed a medication. Please use this twice a day for 1 week.   We would like to see you back in clinic in 1 week to discuss your lab results and see if this medication is helping.  -Dr. Nancy MarusMayo

## 2016-11-23 ENCOUNTER — Ambulatory Visit: Payer: Medicaid Other | Admitting: Internal Medicine

## 2016-12-01 ENCOUNTER — Ambulatory Visit: Payer: Medicaid Other | Admitting: Internal Medicine

## 2017-09-06 ENCOUNTER — Other Ambulatory Visit: Payer: Self-pay

## 2017-09-06 ENCOUNTER — Emergency Department (HOSPITAL_COMMUNITY): Payer: Medicaid Other

## 2017-09-06 ENCOUNTER — Emergency Department (HOSPITAL_COMMUNITY)
Admission: EM | Admit: 2017-09-06 | Discharge: 2017-09-06 | Disposition: A | Discharge status: Home / Self Care | Payer: Medicaid Other | Attending: Emergency Medicine | Admitting: Emergency Medicine

## 2017-09-06 ENCOUNTER — Encounter (HOSPITAL_COMMUNITY): Payer: Self-pay

## 2017-09-06 DIAGNOSIS — E860 Dehydration: Secondary | ICD-10-CM | POA: Insufficient documentation

## 2017-09-06 DIAGNOSIS — Z3491 Encounter for supervision of normal pregnancy, unspecified, first trimester: Secondary | ICD-10-CM

## 2017-09-06 DIAGNOSIS — O9989 Other specified diseases and conditions complicating pregnancy, childbirth and the puerperium: Secondary | ICD-10-CM | POA: Diagnosis not present

## 2017-09-06 DIAGNOSIS — R1084 Generalized abdominal pain: Secondary | ICD-10-CM | POA: Diagnosis not present

## 2017-09-06 DIAGNOSIS — Z79899 Other long term (current) drug therapy: Secondary | ICD-10-CM | POA: Diagnosis not present

## 2017-09-06 DIAGNOSIS — O208 Other hemorrhage in early pregnancy: Secondary | ICD-10-CM | POA: Diagnosis not present

## 2017-09-06 DIAGNOSIS — Z3A01 Less than 8 weeks gestation of pregnancy: Secondary | ICD-10-CM | POA: Insufficient documentation

## 2017-09-06 DIAGNOSIS — R102 Pelvic and perineal pain: Secondary | ICD-10-CM | POA: Diagnosis not present

## 2017-09-06 DIAGNOSIS — O468X1 Other antepartum hemorrhage, first trimester: Secondary | ICD-10-CM

## 2017-09-06 DIAGNOSIS — O418X10 Other specified disorders of amniotic fluid and membranes, first trimester, not applicable or unspecified: Secondary | ICD-10-CM

## 2017-09-06 LAB — COMPREHENSIVE METABOLIC PANEL
ALBUMIN: 3.8 g/dL (ref 3.5–5.0)
ALT: 11 U/L — AB (ref 14–54)
AST: 14 U/L — AB (ref 15–41)
Alkaline Phosphatase: 47 U/L (ref 38–126)
Anion gap: 7 (ref 5–15)
BUN: 6 mg/dL (ref 6–20)
CHLORIDE: 104 mmol/L (ref 101–111)
CO2: 21 mmol/L — AB (ref 22–32)
Calcium: 8.8 mg/dL — ABNORMAL LOW (ref 8.9–10.3)
Creatinine, Ser: 0.53 mg/dL (ref 0.44–1.00)
Glucose, Bld: 75 mg/dL (ref 65–99)
Potassium: 3.9 mmol/L (ref 3.5–5.1)
Sodium: 132 mmol/L — ABNORMAL LOW (ref 135–145)
Total Bilirubin: 0.5 mg/dL (ref 0.3–1.2)
Total Protein: 6.8 g/dL (ref 6.5–8.1)

## 2017-09-06 LAB — CBC
HCT: 35.8 % — ABNORMAL LOW (ref 36.0–46.0)
HEMOGLOBIN: 11.8 g/dL — AB (ref 12.0–15.0)
MCH: 29.6 pg (ref 26.0–34.0)
MCHC: 33 g/dL (ref 30.0–36.0)
MCV: 89.9 fL (ref 78.0–100.0)
Platelets: 283 10*3/uL (ref 150–400)
RBC: 3.98 MIL/uL (ref 3.87–5.11)
RDW: 11.7 % (ref 11.5–15.5)
WBC: 7.3 10*3/uL (ref 4.0–10.5)

## 2017-09-06 LAB — URINALYSIS, ROUTINE W REFLEX MICROSCOPIC
Bilirubin Urine: NEGATIVE
GLUCOSE, UA: NEGATIVE mg/dL
HGB URINE DIPSTICK: NEGATIVE
Ketones, ur: NEGATIVE mg/dL
LEUKOCYTES UA: NEGATIVE
Nitrite: NEGATIVE
PROTEIN: NEGATIVE mg/dL
SPECIFIC GRAVITY, URINE: 1.011 (ref 1.005–1.030)
pH: 7 (ref 5.0–8.0)

## 2017-09-06 LAB — I-STAT BETA HCG BLOOD, ED (MC, WL, AP ONLY)

## 2017-09-06 LAB — LIPASE, BLOOD: LIPASE: 23 U/L (ref 11–51)

## 2017-09-06 LAB — HCG, QUANTITATIVE, PREGNANCY: HCG, BETA CHAIN, QUANT, S: 60129 m[IU]/mL — AB (ref <5)

## 2017-09-06 MED ORDER — VITAMIN B-6 25 MG PO TABS: 12.5000 mg | ORAL_TABLET | Freq: Three times a day (TID) | ORAL | 0 refills | Status: DC | PRN

## 2017-09-06 MED ORDER — DOXYLAMINE SUCCINATE (SLEEP) 25 MG PO TABS: 12.5000 mg | ORAL_TABLET | Freq: Three times a day (TID) | ORAL | 0 refills | Status: DC | PRN

## 2017-09-06 MED ORDER — SODIUM CHLORIDE 0.9 % IV BOLUS (SEPSIS)
1000.0000 mL | Freq: Once | INTRAVENOUS | Status: AC
  Administered 2017-09-06: 1000 mL via INTRAVENOUS

## 2017-09-06 MED ORDER — CVS PRENATAL MULTI+DHA 27-0.8-250 MG PO CAPS: 1.0000 | ORAL_CAPSULE | Freq: Every day | ORAL | 0 refills | Status: DC

## 2017-09-06 NOTE — ED Triage Notes (Signed)
Pt reports taking positive pregnancy test this week and now she is extremely tired, eating a lot more than usual, having episodes of diarrhea, and abdominal pain. Denies vaginal bleeding or discharge. Denies n/v

## 2017-09-06 NOTE — Discharge Instructions (Signed)
For nausea, take ONE HALF TABLET OF DOXYLAMINE and ONE HALF TABLET OF VITAMIN B6, every 8 hours, as needed.  Start taking a MULTIVITAMIN today. I recommend any PRENATAL multivitamin. Make sure it contains folic acid/folate as well as DHA.  [facility] with [facility] your [facility] and ultrasound findings.

## 2017-09-06 NOTE — ED Notes (Signed)
Patient returned from Ultrasound. 

## 2017-09-06 NOTE — ED Provider Notes (Signed)
[facility] EMERGENCY DEPARTMENT Provider Note   CSN: [id number] Arrival date & time: 09/06/17  1257     History   Chief Complaint Chief Complaint  Patient presents with  . Abdominal Pain    pregnent    HPI Kaleea Penner is a 26 y.o. female.  HPI   26 yo F with G3P2 here with abdominal pain. Pt reports that over the last 1-2 weeks she has noticed nausea, loose BM, and general fatigue. These sx are worse in the mornings and improve throughout the day. She's had occasional diffuse abdominal cramping as well, with no ongoing pain. She states her last period was in the end of Sep, and she had a positive home pregnancy test. She denies any vaginal bleeding or discharge. No leakage of fluid. She was on OCPs previously but has stopped. No fever or chills. No dysuria or urinary sx. Her pain was cramp like, diffuse, and resolves intermittently. Did not have similar sx with her 2 prior pregnancies per report.  Past Medical History:  Diagnosis Date  . Medical history non-contributory     Patient Active Problem List   Diagnosis Date Noted  . Abdominal pain, epigastric 11/16/2016  . Refugee health examination 02/20/2015  . 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 NO PAST SURGERIES      OB History    Gravida Para Term Preterm AB Living   3 2 2  0 0 2   SAB TAB Ectopic Multiple Live Births   0 0 0 0 2       Home Medications    Prior to Admission medications   Medication Sig Start Date End Date Taking? Authorizing Provider  Multiple Vitamin (MULTIVITAMIN WITH MINERALS) TABS tablet Take 1 tablet daily by mouth.   Yes [provider]  acetaminophen (TYLENOL) 325 MG tablet Take 2 tablets (650 mg total) by mouth every 4 (four) hours as needed (for pain scale < 4). Patient not taking: Reported on 09/06/2017 12/08/15   Heckart, Joice Lofts, MD  doxylamine, Sleep, (UNISOM) 25 MG tablet Take 0.5 tablets (12.5 mg  total) every 8 (eight) hours as needed by mouth (nausea). 09/06/17   Shaune Pollack, MD  famotidine (PEPCID) 20 MG tablet Take 1 tablet (20 mg total) by mouth 2 (two) times daily. Patient not taking: Reported on 09/06/2017 11/16/16   Mayo, Allyn Kenner, MD  Prenatal MV-Min-Fe Fum-FA-DHA ([employer] PRENATAL MULTI+DHA) 27-0.8-250 MG CAPS Take 1 tablet daily by mouth. 09/06/17 12/05/17  Shaune Pollack, MD  Prenatal Vit-Fe Fumarate-FA (PRENATAL VITAMINS) 28-0.8 MG TABS Take 1 tablet by mouth daily. Patient not taking: Reported on 09/06/2017 12/08/15   Heckart, Joice Lofts, MD  pyridOXINE (PYRIDOXINE) 25 MG tablet Take 0.5 tablets (12.5 mg total) every 8 (eight) hours as needed by mouth (nausea). 09/06/17   Shaune Pollack, MD    Family History No family history on file.  Social History Social History   Tobacco Use  . Smoking status: Never Smoker  . Smokeless tobacco: Never Used  Substance Use Topics  . Alcohol use: No  . Drug use: No     Allergies   Patient has no known allergies.   Review of Systems Review of Systems  Constitutional: Positive for fatigue.  Gastrointestinal: Positive for abdominal pain, diarrhea and nausea.  All other systems reviewed and are negative.    Physical Exam Updated Vital Signs BP 101/72   Pulse 82   Temp 98.4 F (36.9 C) (Oral)  Resp 16   LMP 07/25/2017   SpO2 100%   Physical Exam  Constitutional: She is oriented to person, place, and time. She appears well-developed and well-nourished. No distress.  HENT:  Head: Normocephalic and atraumatic.  Eyes: Conjunctivae are normal.  Neck: Neck supple.  Cardiovascular: Normal rate, regular rhythm and normal heart sounds. Exam reveals no friction rub.  No murmur heard. Pulmonary/Chest: Effort normal and breath sounds normal. No respiratory distress. She has no wheezes. She has no rales.  Abdominal: Soft. Bowel sounds are normal. She exhibits no distension. There is no tenderness. There is no rebound and no guarding.    Musculoskeletal: She exhibits no edema.  Neurological: She is alert and oriented to person, place, and time. She exhibits normal muscle tone.  Skin: Skin is warm. Capillary refill takes less than 2 seconds.  Psychiatric: She has a normal mood and affect.  Nursing note and vitals reviewed.    ED Treatments / Results  Labs (all labs ordered are listed, but only abnormal results are displayed) Labs Reviewed  COMPREHENSIVE METABOLIC PANEL - Abnormal; Notable for the following components:      Result Value   Sodium 132 (*)    CO2 21 (*)    Calcium 8.8 (*)    AST 14 (*)    ALT 11 (*)    All other components within normal limits  CBC - Abnormal; Notable for the following components:   Hemoglobin 11.8 (*)    HCT 35.8 (*)    All other components within normal limits  HCG, QUANTITATIVE, PREGNANCY - Abnormal; Notable for the following components:   hCG, Beta Chain, Quant, S 60,129 (*)    All other components within normal limits  I-STAT BETA HCG BLOOD, ED ([facility], [facility], [facility] ONLY) - Abnormal; Notable for the following components:   I-stat hCG, quantitative >2,000.0 (*)    All other components within normal limits  LIPASE, BLOOD  URINALYSIS, ROUTINE W REFLEX MICROSCOPIC    EKG  EKG Interpretation None       Radiology US Ob Comp < 14 Wks  Result Date: 09/06/2017 CLINICAL DATA:  26 y/o F; pelvic pain and diarrhea. Pregnant patient. EXAM: OBSTETRIC <14 WK ULTRASOUND TECHNIQUE: Transabdominal ultrasound was performed for evaluation of the gestation as well as the maternal uterus and adnexal regions. COMPARISON:  06/11/2015 pelvic ultrasound FINDINGS: Intrauterine gestational sac: Single Yolk sac:  Visualized. Embryo:  Visualized. Cardiac Activity: Visualized. Heart Rate: 132 bpm CRL:   13.1  mm   7 w 4 d                  [place] EDC: 04/21/2018 Subchorionic hemorrhage:  Small subchorionic hemorrhage. Maternal uterus/adnexae: Normal right ovary. Left ovary simple appearing cyst measuring up to 3.8  cm. Trace simple free pelvic fluid, likely physiologic. IMPRESSION: Single live intrauterine pregnancy with estimated gestational age of [redacted] weeks and 4 days. Small subchorionic hemorrhage. Electronically Signed   By: Mitzi Hansen M.D.   On: 09/06/2017 22:08    Procedures Procedures (including critical care time)  Medications Ordered in ED Medications  sodium chloride 0.9 % bolus 1,000 mL (0 mLs Intravenous Stopped 09/06/17 2036)     Initial Impression / Assessment and Plan / ED Course  I have reviewed the triage vital signs and the nursing notes.  Pertinent labs & imaging results that were available during my care of the patient were reviewed by me and considered in my medical decision making (see chart for details).  26 yo F here with mild diffuse abd pain, nausea, loose stools worse in the AM. Pt noted to have +UPT here. She adamantly denies any vaginal bleeding or discharge. Lab work si very reassuring and she feels better with IVF. Suspect this is pregnancy related, though loose stools raise possibility of viral illness. No recent ABX use, no fevers, no focal TTP to suggest appendicitis, diverticulitis, colitis, or other intra-abd emergency. U/S obtained and shows viable IUP at 6575w4d, with small subchorion hemorrhage. She is Rh+. No signs of fetal compromise. Will place on PNV, antiemetics, and d/c with outpt follow-up.  Final Clinical Impressions(s) / ED Diagnoses   Final diagnoses:  First trimester pregnancy  Dehydration  Subchorionic hematoma in first trimester, single or unspecified fetus    ED Discharge Orders        Ordered    doxylamine, Sleep, (UNISOM) 25 MG tablet  Every 8 hours PRN     09/06/17 2222    pyridOXINE (PYRIDOXINE) 25 MG tablet  Every 8 hours PRN     09/06/17 2222    Prenatal MV-Min-Fe Fum-FA-DHA ([employer] PRENATAL MULTI+DHA) 27-0.8-250 MG CAPS  Daily     09/06/17 2222       Shaune PollackIsaacs, Vitaliy Eisenhour, MD 09/07/17 0134

## 2017-09-06 NOTE — ED Notes (Signed)
Patient transported to Ultrasound 

## 2017-09-06 NOTE — ED Notes (Signed)
ED provider at bedside.

## 2017-10-11 ENCOUNTER — Encounter: Payer: Self-pay | Admitting: Advanced Practice Midwife

## 2017-10-14 ENCOUNTER — Inpatient Hospital Stay (HOSPITAL_COMMUNITY)
Admission: AD | Admit: 2017-10-14 | Discharge: 2017-10-14 | Disposition: A | Discharge status: Home / Self Care | Payer: Medicaid Other | Source: Ambulatory Visit | Attending: Family Medicine | Admitting: Family Medicine

## 2017-10-14 ENCOUNTER — Other Ambulatory Visit: Payer: Self-pay

## 2017-10-14 ENCOUNTER — Encounter (HOSPITAL_COMMUNITY): Payer: Self-pay | Admitting: *Deleted

## 2017-10-14 DIAGNOSIS — O219 Vomiting of pregnancy, unspecified: Secondary | ICD-10-CM

## 2017-10-14 DIAGNOSIS — O210 Mild hyperemesis gravidarum: Secondary | ICD-10-CM | POA: Diagnosis not present

## 2017-10-14 DIAGNOSIS — Z3A11 11 weeks gestation of pregnancy: Secondary | ICD-10-CM | POA: Insufficient documentation

## 2017-10-14 LAB — URINALYSIS, ROUTINE W REFLEX MICROSCOPIC
Bilirubin Urine: NEGATIVE
Glucose, UA: NEGATIVE mg/dL
HGB URINE DIPSTICK: NEGATIVE
Ketones, ur: NEGATIVE mg/dL
Leukocytes, UA: NEGATIVE
Nitrite: NEGATIVE
PH: 6 (ref 5.0–8.0)
PROTEIN: NEGATIVE mg/dL
SPECIFIC GRAVITY, URINE: 1.021 (ref 1.005–1.030)

## 2017-10-14 LAB — POCT PREGNANCY, URINE: PREG TEST UR: POSITIVE — AB

## 2017-10-14 MED ORDER — PROMETHAZINE HCL 25 MG/ML IJ SOLN
12.5000 mg | Freq: Once | INTRAMUSCULAR | Status: AC
  Administered 2017-10-14: 12.5 mg via INTRAMUSCULAR
  Filled 2017-10-14: qty 1

## 2017-10-14 MED ORDER — PROMETHAZINE HCL 25 MG RE SUPP: 25.0000 mg | Freq: Four times a day (QID) | RECTAL | 3 refills | Status: DC | PRN

## 2017-10-14 NOTE — Discharge Instructions (Signed)

## 2017-10-14 NOTE — MAU Note (Signed)
Pt reports she has been having n/v for several weeks . Taking medication for it without relief. Stated she has had some blood in her emesis as well. Feeling weak and tired.

## 2017-10-14 NOTE — MAU Provider Note (Signed)
Chief Complaint: Nausea and Emesis   First Provider Initiated Contact with Patient 10/14/17 1741      SUBJECTIVE HPI: Carla Haley is a 26 y.o. G3P2002 at 10734w3d by LMP who presents to maternity admissions reporting nausea and vomiting. She reports vomiting 4 times yesterday but none today. States she has taken her prescribed vitamin b6 and unisom with little relief. Reports that she just feels tired, weak and "does not want to eat anything". She denies abdominal pain and cramping. She denies vaginal bleeding, vaginal itching/burning, urinary symptoms, h/a, dizziness, n/v, or fever/chills.    Past Medical History:  Diagnosis Date  . Medical history non-contributory    Past Surgical History:  Procedure Laterality Date  . NO PAST SURGERIES     Social History   Socioeconomic History  . Marital status: Married    Spouse name: Not on file  . Number of children: Not on file  . Years of education: Not on file  . Highest education level: Not on file  Social Needs  . Financial resource strain: Not on file  . Food insecurity - worry: Not on file  . Food insecurity - inability: Not on file  . Transportation needs - medical: Not on file  . Transportation needs - non-medical: Not on file  Occupational History  . Not on file  Tobacco Use  . Smoking status: Never Smoker  . Smokeless tobacco: Never Used  Substance and Sexual Activity  . Alcohol use: No  . Drug use: No  . Sexual activity: Yes    Birth control/protection: None  Other Topics Concern  . Not on file  Social History Narrative  . Not on file   No current facility-administered medications on file prior to encounter.    Current Outpatient Medications on File Prior to Encounter  Medication Sig Dispense Refill  . acetaminophen (TYLENOL) 325 MG tablet Take 2 tablets (650 mg total) by mouth every 4 (four) hours as needed (for pain scale < 4). (Patient not taking: Reported on 09/06/2017) 30 tablet 0  . doxylamine, Sleep, (UNISOM)  25 MG tablet Take 0.5 tablets (12.5 mg total) every 8 (eight) hours as needed by mouth (nausea). 30 tablet 0  . famotidine (PEPCID) 20 MG tablet Take 1 tablet (20 mg total) by mouth 2 (two) times daily. (Patient not taking: Reported on 09/06/2017) 20 tablet 0  . Multiple Vitamin (MULTIVITAMIN WITH MINERALS) TABS tablet Take 1 tablet daily by mouth.    . Prenatal MV-Min-Fe Fum-FA-DHA ([employer] PRENATAL MULTI+DHA) 27-0.8-250 MG CAPS Take 1 tablet daily by mouth. 90 capsule 0  . Prenatal Vit-Fe Fumarate-FA (PRENATAL VITAMINS) 28-0.8 MG TABS Take 1 tablet by mouth daily. (Patient not taking: Reported on 09/06/2017) 30 tablet 11  . pyridOXINE (PYRIDOXINE) 25 MG tablet Take 0.5 tablets (12.5 mg total) every 8 (eight) hours as needed by mouth (nausea). 30 tablet 0   No Known Allergies  ROS:  Review of Systems  Constitutional: Positive for appetite change and fatigue. Negative for chills, fever and unexpected weight change.  Respiratory: Negative.   Cardiovascular: Negative.   Gastrointestinal: Positive for nausea and vomiting. Negative for abdominal pain, constipation and diarrhea.  Genitourinary: Negative for difficulty urinating, pelvic pain, vaginal bleeding, vaginal discharge and vaginal pain.  Musculoskeletal: Negative.   Skin: Negative.   Neurological: Positive for weakness. Negative for dizziness, syncope, light-headedness and headaches.  Psychiatric/Behavioral: Negative.    I have reviewed patient's Past Medical Hx, Surgical Hx, Family Hx, Social Hx, medications and allergies.   Physical  Exam   Patient Vitals for the past 24 hrs:  BP Temp Pulse Resp Height Weight  10/14/17 1643 103/62 98.3 F (36.8 C) 93 18 5\' 3"  (1.6 m) 107 lb (48.5 kg)   Constitutional: Well-developed, well-nourished female in no acute distress.  Cardiovascular: normal rate Respiratory: normal effort GI: Abd soft, non-tender. Pos BS x 4 MS: Extremities nontender, no edema, normal ROM Neurologic: Alert and oriented x 4.   GU: Neg CVAT.  FHT 167 by doppler  LAB RESULTS Results for orders placed or performed during the hospital encounter of 10/14/17 (from the past 24 hour(s))  Urinalysis, Routine w reflex microscopic     Status: Abnormal   Collection Time: 10/14/17  4:47 PM  Result Value Ref Range   Color, Urine YELLOW YELLOW   APPearance CLOUDY (A) CLEAR   Specific Gravity, Urine 1.021 1.005 - 1.030   pH 6.0 5.0 - 8.0   Glucose, UA NEGATIVE NEGATIVE mg/dL   Hgb urine dipstick NEGATIVE NEGATIVE   Bilirubin Urine NEGATIVE NEGATIVE   Ketones, ur NEGATIVE NEGATIVE mg/dL   Protein, ur NEGATIVE NEGATIVE mg/dL   Nitrite NEGATIVE NEGATIVE   Leukocytes, UA NEGATIVE NEGATIVE  Pregnancy, urine POC     Status: Abnormal   Collection Time: 10/14/17  4:58 PM  Result Value Ref Range   Preg Test, Ur POSITIVE (A) NEGATIVE     IMAGING No results found.  MAU Management/MDM: Ordered labs and reviewed results.  Urinalysis   Treatments in MAU included Phenergan 12.5 IM. Relief of nausea and vomiting with phenergan. Able to eat crackers and drink juice with no regurgitation.  Pt discharged with Rx for Phenergan- patient does not want oral dosing she request other route than oral.  ASSESSMENT 1. Nausea and vomiting during pregnancy prior to [redacted] weeks gestation     PLAN Discharge home Rx for Phenergan 25mg  suppository  Follow up with initial prenatal visit in January and return to MAU as needed for emergencies.    Allergies as of 10/14/2017   No Known Allergies     Medication List    TAKE these medications   acetaminophen 325 MG tablet Commonly known as:  TYLENOL Take 2 tablets (650 mg total) by mouth every 4 (four) hours as needed (for pain scale < 4).   [employer]S PRENATAL MULTI+DHA 27-0.8-250 MG Caps Take 1 tablet daily by mouth.   doxylamine (Sleep) 25 MG tablet Commonly known as:  UNISOM Take 0.5 tablets (12.5 mg total) every 8 (eight) hours as needed by mouth (nausea).   famotidine 20 MG  tablet Commonly known as:  PEPCID Take 1 tablet (20 mg total) by mouth 2 (two) times daily.   multivitamin with minerals Tabs tablet Take 1 tablet daily by mouth.   Prenatal Vitamins 28-0.8 MG Tabs Take 1 tablet by mouth daily.   promethazine 25 MG suppository Commonly known as:  PHENERGAN Place 1 suppository (25 mg total) rectally every 6 (six) hours as needed for nausea or vomiting.   vitamin B-6 25 MG tablet Commonly known as:  pyridOXINE Take 0.5 tablets (12.5 mg total) every 8 (eight) hours as needed by mouth (nausea).       Steward DroneVeronica Melayah Skorupski  Certified Nurse-Midwife 10/14/2017  5:57 PM

## 2017-11-02 NOTE — L&D Delivery Note (Signed)
Delivery Note At 1:30 PM a viable female was delivered via  (Presentation: OA. Compound left hand).  APGAR: 9, 9; weight pending.   Placenta status: spontaneous, intact.  Cord: 3VC with the following complications:None  Cord pH: NA. Moderate bleeding. Pitocin bolus infusing. Fundus boggy, but firmed quickly w/ massage. Still bleeding small-mod amount. Cervix inspected, abrasions present on anterior lip, but no active bleeding. Methergine given. Scant bleeding. Will check CBC and give Methergine series.   Anesthesia: None Episiotomy: None Lacerations: None Suture Repair: NA Est. Blood Loss (mL): 600 ml  Mom to postpartum.  Baby to Couplet care / Skin to Skin   Please schedule this patient for Postpartum visit in: 4 weeks with the following provider: Any provider For C/S patients schedule nurse incision check in weeks 2 weeks: no Low risk pregnancy complicated by: None Delivery mode:  SVD Anticipated Birth Control:  Nexplanon PP Procedures needed: none  Schedule Integrated BH visit: no  [place] Janasha Barkalow 04/10/2018, 3:14 PM

## 2017-11-03 ENCOUNTER — Ambulatory Visit (INDEPENDENT_AMBULATORY_CARE_PROVIDER_SITE_OTHER): Payer: Medicaid Other | Admitting: Obstetrics and Gynecology

## 2017-11-03 ENCOUNTER — Other Ambulatory Visit (HOSPITAL_COMMUNITY)
Admission: RE | Admit: 2017-11-03 | Discharge: 2017-11-03 | Disposition: A | Discharge status: Home / Self Care | Payer: Medicaid Other | Source: Ambulatory Visit | Attending: Advanced Practice Midwife | Admitting: Advanced Practice Midwife

## 2017-11-03 ENCOUNTER — Encounter: Payer: Self-pay | Admitting: Obstetrics and Gynecology

## 2017-11-03 DIAGNOSIS — Z3482 Encounter for supervision of other normal pregnancy, second trimester: Secondary | ICD-10-CM | POA: Diagnosis not present

## 2017-11-03 DIAGNOSIS — Z3480 Encounter for supervision of other normal pregnancy, unspecified trimester: Secondary | ICD-10-CM | POA: Insufficient documentation

## 2017-11-03 LAB — POCT URINALYSIS DIP (DEVICE)
Bilirubin Urine: NEGATIVE
Glucose, UA: NEGATIVE mg/dL
Hgb urine dipstick: NEGATIVE
Ketones, ur: NEGATIVE mg/dL
Leukocytes, UA: NEGATIVE
Nitrite: NEGATIVE
Protein, ur: NEGATIVE mg/dL
Specific Gravity, Urine: >=1.03 (ref 1.005–1.030)
Urobilinogen, UA: 1 mg/dL (ref 0.0–1.0)
pH: 7 (ref 5.0–8.0)

## 2017-11-03 MED ORDER — PRENATAL GUMMIES/DHA & FA 0.4-32.5 MG PO CHEW: 1.0000 | CHEWABLE_TABLET | Freq: Every day | ORAL | 12 refills | Status: DC

## 2017-11-03 MED ORDER — PROMETHAZINE HCL 25 MG RE SUPP: 25.0000 mg | Freq: Four times a day (QID) | RECTAL | 3 refills | Status: DC | PRN

## 2017-11-03 NOTE — Progress Notes (Signed)
FLU shot declined.PMH form completed.

## 2017-11-03 NOTE — Progress Notes (Signed)
  Subjective:    Carla Haley is being seen today for her first obstetrical visit.  This is a planned pregnancy. She is at 4661w2d gestation. Her obstetrical history is significant for no risks noted. Relationship with FOB: spouse, living together. Patient does intend to breast feed. Pregnancy history fully reviewed.  Patient reports nausea, vomiting and occassional dyspareunia.  Review of Systems:   Review of Systems  Constitutional: Negative.   HENT: Negative.   Eyes: Negative.   Respiratory: Negative.   Cardiovascular: Negative.   Gastrointestinal: Positive for nausea and vomiting.  Endocrine: Negative.   Genitourinary: Positive for dyspareunia.  Musculoskeletal: Negative.   Skin: Negative.   Allergic/Immunologic: Negative.   Neurological: Negative.   Hematological: Negative.   Psychiatric/Behavioral: Negative.    Objective:   BP (!) 98/58   Pulse 87   Wt 108 lb 11.2 oz (49.3 kg)   LMP 07/26/2017   BMI 19.26 kg/m  Physical Exam  Constitutional: She is oriented to person, place, and time. She appears well-developed and well-nourished.  HENT:  Head: Normocephalic.  Eyes: Pupils are equal, round, and reactive to light.  Neck: Normal range of motion.  Cardiovascular: Normal rate, regular rhythm, normal heart sounds and intact distal pulses.  Respiratory: Effort normal and breath sounds normal.  GI: Soft. Bowel sounds are normal.  Genitourinary: Vagina normal.  Genitourinary Comments: Uterus: enlarged, S=D, cx: smooth, pink, no lesions, scant amt of thick, white vaginal d/c -- Pap and & GC/CT done, closed/long/firm, no CMT or friability, no adnexal tenderness   Musculoskeletal: Normal range of motion.  Neurological: She is alert and oriented to person, place, and time. She has normal reflexes.  Skin: Skin is warm and dry.  Psychiatric: She has a normal mood and affect. Her behavior is normal. Judgment and thought content normal.    Maternal Exam:  Abdomen: Patient  reports no abdominal tenderness. Introitus: Normal vulva. Normal vagina.  Ferning test: not done.  Nitrazine test: not done. Amniotic fluid character: not assessed.  Pelvis: adequate for delivery.   Cervix: Cervix evaluated by sterile speculum exam and digital exam.      Assessment:    Pregnancy: [id number] Patient Active Problem List   Diagnosis Date Noted  . Supervision of other normal pregnancy, antepartum 11/03/2017  . Abdominal pain, epigastric 11/16/2016  . Refugee health examination 02/20/2015  . 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 Prenatal vitamins. Problem list reviewed and updated. AFP3 discussed: requested. Planning for next visit in 4 wks Role of ultrasound in pregnancy discussed; fetal survey: requested. Amniocentesis discussed: not indicated. Follow up in 4 weeks. 100% of 20 min visit spent on counseling and coordination of care.     Raelyn Moraolitta Mcdonald Reiling, MSN, CNM 11/03/2017

## 2017-11-03 NOTE — Progress Notes (Signed)
Panorama sent , pick up #[id number].

## 2017-11-04 LAB — CYTOLOGY - PAP
CHLAMYDIA, DNA PROBE: NEGATIVE
DIAGNOSIS: NEGATIVE
Neisseria Gonorrhea: NEGATIVE

## 2017-11-05 LAB — URINE CULTURE, OB REFLEX

## 2017-11-05 LAB — CULTURE, OB URINE

## 2017-11-09 ENCOUNTER — Encounter: Payer: Self-pay | Admitting: *Deleted

## 2017-11-09 LAB — HEMOGLOBINOPATHY EVALUATION
Ferritin: 63 ng/mL (ref 15–150)
HGB A2 QUANT: 3 % (ref 1.8–3.2)
HGB A: 96.1 % — AB (ref 96.4–98.8)
HGB F QUANT: 0.9 % (ref 0.0–2.0)
HGB SOLUBILITY: NEGATIVE
Hgb C: 0 %
Hgb S: 0 %
Hgb Variant: 0 %

## 2017-11-09 LAB — OBSTETRIC PANEL, INCLUDING HIV
Antibody Screen: NEGATIVE
BASOS ABS: 0 10*3/uL (ref 0.0–0.2)
Basos: 0 %
EOS (ABSOLUTE): 0.1 10*3/uL (ref 0.0–0.4)
EOS: 2 %
HEMOGLOBIN: 10.7 g/dL — AB (ref 11.1–15.9)
HEP B S AG: NEGATIVE
HIV Screen 4th Generation wRfx: NONREACTIVE
Hematocrit: 33.4 % — ABNORMAL LOW (ref 34.0–46.6)
IMMATURE GRANS (ABS): 0 10*3/uL (ref 0.0–0.1)
IMMATURE GRANULOCYTES: 0 %
Lymphocytes Absolute: 1.7 10*3/uL (ref 0.7–3.1)
Lymphs: 33 %
MCH: 30.2 pg (ref 26.6–33.0)
MCHC: 32 g/dL (ref 31.5–35.7)
MCV: 94 fL (ref 79–97)
MONOCYTES: 6 %
Monocytes Absolute: 0.3 10*3/uL (ref 0.1–0.9)
NEUTROS PCT: 59 %
Neutrophils Absolute: 3.2 10*3/uL (ref 1.4–7.0)
PLATELETS: 245 10*3/uL (ref 150–379)
RBC: 3.54 x10E6/uL — ABNORMAL LOW (ref 3.77–5.28)
RDW: 12.8 % (ref 12.3–15.4)
RH TYPE: POSITIVE
RPR: NONREACTIVE
RUBELLA: 12.4 {index} (ref >0.99)
WBC: 5.3 10*3/uL (ref 3.4–10.8)

## 2017-11-09 LAB — SMN1 COPY NUMBER ANALYSIS (SMA CARRIER SCREENING)

## 2017-11-24 ENCOUNTER — Encounter: Payer: Self-pay | Admitting: Obstetrics and Gynecology

## 2017-11-24 DIAGNOSIS — O418X90 Other specified disorders of amniotic fluid and membranes, unspecified trimester, not applicable or unspecified: Secondary | ICD-10-CM | POA: Insufficient documentation

## 2017-11-24 DIAGNOSIS — O468X9 Other antepartum hemorrhage, unspecified trimester: Secondary | ICD-10-CM

## 2017-11-29 ENCOUNTER — Encounter: Payer: Self-pay | Admitting: *Deleted

## 2017-11-30 ENCOUNTER — Encounter (HOSPITAL_COMMUNITY): Payer: Self-pay | Admitting: Obstetrics and Gynecology

## 2017-12-01 ENCOUNTER — Ambulatory Visit (INDEPENDENT_AMBULATORY_CARE_PROVIDER_SITE_OTHER): Payer: Medicaid Other

## 2017-12-01 VITALS — BP 98/70 | HR 70 | Wt 115.0 lb

## 2017-12-01 DIAGNOSIS — Z3480 Encounter for supervision of other normal pregnancy, unspecified trimester: Secondary | ICD-10-CM

## 2017-12-01 NOTE — Progress Notes (Signed)
   PRENATAL VISIT NOTE  Subjective:  Carla Haley is a 27 y.o. G3P2002 at 4875w2d being seen today for ongoing prenatal care.  She is currently monitored for the following issues for this low-risk pregnancy and has Post partum depression; Supervision of other normal pregnancy, antepartum; and Subchorionic hematoma on their problem list.  Patient reports no complaints.  Contractions: Not present. Vag. Bleeding: None.  Movement: Present. Denies leaking of fluid.   The following portions of the patient's history were reviewed and updated as appropriate: allergies, current medications, past family history, past medical history, past social history, past surgical history and problem list. Problem list updated.  Objective:   Vitals:   12/01/17 1042  BP: 98/70  Pulse: 70  Weight: 115 lb (52.2 kg)    Fetal Status: Fetal Heart Rate (bpm): 158   Movement: Present     General:  Alert, oriented and cooperative. Patient is in no acute distress.  Skin: Skin is warm and dry. No rash noted.   Cardiovascular: Normal heart rate noted  Respiratory: Normal respiratory effort, no problems with respiration noted  Abdomen: Soft, gravid, appropriate for gestational age.  Pain/Pressure: Present     Pelvic: Cervical exam deferred        Extremities: Normal range of motion.     Mental Status:  Normal mood and affect. Normal behavior. Normal judgment and thought content.   Assessment and Plan:  Pregnancy: G3P2002 at 6875w2d  1. Supervision of other normal pregnancy, antepartum - No complaints. Routine care - Quad screen today - Anatomy u/s scheduled 2/6  Preterm labor symptoms and general obstetric precautions including but not limited to vaginal bleeding, contractions, leaking of fluid and fetal movement were reviewed in detail with the patient. Please refer to After Visit Summary for other counseling recommendations.  Return in about 4 weeks (around 12/29/2017) for Return OB visit.   Rolm BookbinderCaroline M Siegfried Vieth,  CNM 12/01/17 11:07 AM

## 2017-12-01 NOTE — Patient Instructions (Signed)
Safe Medications in Pregnancy   Acne: Benzoyl Peroxide Salicylic Acid  Backache/Headache: Tylenol: 2 regular strength every 4 hours OR              2 Extra strength every 6 hours  Colds/Coughs/Allergies: Benadryl (alcohol free) 25 mg every 6 hours as needed Breath right strips Claritin Cepacol throat lozenges Chloraseptic throat spray Cold-Eeze- up to three times per day Cough drops, alcohol free Flonase (by prescription only) Guaifenesin Mucinex Robitussin DM (plain only, alcohol free) Saline nasal spray/drops Sudafed (pseudoephedrine) & Actifed ** use only after [redacted] weeks gestation and if you do not have high blood pressure Tylenol Vicks Vaporub Zinc lozenges Zyrtec   Constipation: Colace Ducolax suppositories Fleet enema Glycerin suppositories Metamucil Milk of magnesia Miralax Senokot Smooth move tea  Diarrhea: Kaopectate Imodium A-D  *NO pepto Bismol  Hemorrhoids: Anusol Anusol HC Preparation H Tucks  Indigestion: Tums Maalox Mylanta Zantac  Pepcid  Insomnia: Benadryl (alcohol free) 25mg every 6 hours as needed Tylenol PM Unisom, no Gelcaps  Leg Cramps: Tums MagGel  Nausea/Vomiting:  Bonine Dramamine Emetrol Ginger extract Sea bands Meclizine  Nausea medication to take during pregnancy:  Unisom (doxylamine succinate 25 mg tablets) Take one tablet daily at bedtime. If symptoms are not adequately controlled, the dose can be increased to a maximum recommended dose of two tablets daily (1/2 tablet in the morning, 1/2 tablet mid-afternoon and one at bedtime). Vitamin B6 100mg tablets. Take one tablet twice a day (up to 200 mg per day).  Skin Rashes: Aveeno products Benadryl cream or 25mg every 6 hours as needed Calamine Lotion 1% cortisone cream  Yeast infection: Gyne-lotrimin 7 Monistat 7   **If taking multiple medications, please check labels to avoid duplicating the same active ingredients **take medication as directed on  the label ** Do not exceed 4000 mg of tylenol in 24 hours **Do not take medications that contain aspirin or ibuprofen    AREA PEDIATRIC/FAMILY PRACTICE PHYSICIANS  [facility] [address], [address] [place], [place]  [address] Phone - [phone]   Fax - [phone]  [facility] [address] [place], [place] [address] Phone - [phone]   Fax - [phone]  [facility] [address] [place], [place]  [address] Phone - [phone]   Fax - [phone]  [facility] [address] [place], [place]  [address] Phone - [phone]   Fax - [phone]  [facility] [address] [place], [place]  [address] Phone - [phone]   Fax - [phone]  [facility] [address] [place], [place]  [address] Phone - [phone]   Fax - [phone]  [facility] [address] [place], [place]  [address] Phone - [phone]   Fax - [phone]  [facility] [address] [place], [place]  [address] Phone - [phone]   Fax - [phone]  [facility] [address] [place], [place]  [address] Phone - [phone]   Fax - [phone] [facility] [address] [place], [place]  [address] Phone - [phone]   Fax - [phone]  [facility] [address] [place], [place]  [address] Phone - [phone]   Fax - [phone]  [facility] [address] , [place]  [address] Phone - [phone]   Fax -   [phone]  [facility] [address] [place], [place]  [address] Phone - [phone]   Fax - [phone]  SHILPA GOSRANI [address] [place], [place]  [address] Phone - [phone]  [facility] [address] [place], [place]  [address] Phone - [phone]   Fax - [phone]  [facility] [address] [place], [place]  [address] Phone - [phone]   Fax - [phone]  [facility] [address] [place], [place]  [address] Phone - [phone]   Fax - [phone]  [facility] [address] [place], [place]  [address] Phone - [phone]   Fax - [phone]   [facility] [address] [place], [place]  [address] Phone - [phone]   Fax - [phone]  [facility] [address] [place], [place]  [address] Phone - [phone]   Fax - [phone]  DAVID RUBIN [address] [place], [place]  [address] Phone - [phone]   Fax - [phone]  [facility] [address] [place], [place]  [address] Phone - [phone]   Fax - [phone]  [facility] [address] , [place]  [address] Phone - [phone]   Fax - [phone] [facility] [address] [place], [place]  [address] Phone - [phone]   Fax - [phone]  [facility] [address] [place], [place]  [address] Phone - [phone]   Fax - [phone]  [facility] [address] [place], [place]  [address] Phone - [phone]   Fax - [phone]  [facility] [facility] Charlene Flemming MD [address] [place]  [address] Phone [phone]  Fax [phone]  CIRCUMCISION  Circumcision is considered an elective/non-medically necessary procedure. There are many reasons parents decide to have their sons circumsized. During the first year of life circumcised males have a reduced risk of urinary tract infections but after this year the rates between circumcised males and uncircumcised males are the same.  It is safe to have your  son circumcised outside of the hospital and the places above perform them regularly.    Places to have your son circumcised:    [facility] [phone] $480 by 4 wks  [facility] [phone] $[phone] by 4 wks  [facility] [phone] $175 by 2 wks  [facility] [phone] $250 by 7 days [facility] [phone] $269 by 4 wks  These prices sometimes change but are roughly what you can expect to pay. Please call and confirm pricing.     

## 2017-12-07 LAB — AFP TETRA
DIA Mom Value: 0.38
DIA VALUE (EIA): 77.63 pg/mL
DSR (BY AGE) 1 IN: 982
DSR (Second Trimester) 1 IN: 10000
Gestational Age: 18 WEEKS
MSAFP Mom: 0.93
MSAFP: 52 ng/mL
MSHCG MOM: 0.81
MSHCG: 26536 m[IU]/mL
Maternal Age At EDD: 26 yr
Osb Risk: 10000
TEST RESULTS AFP: NEGATIVE
Weight: 115 [lb_av]
uE3 Mom: 1.19
uE3 Value: 1.72 ng/mL

## 2017-12-08 ENCOUNTER — Other Ambulatory Visit: Payer: Self-pay | Admitting: Obstetrics and Gynecology

## 2017-12-08 ENCOUNTER — Ambulatory Visit (HOSPITAL_COMMUNITY)
Admission: RE | Admit: 2017-12-08 | Discharge: 2017-12-08 | Disposition: A | Discharge status: Home / Self Care | Payer: Medicaid Other | Source: Ambulatory Visit | Attending: Obstetrics and Gynecology | Admitting: Obstetrics and Gynecology

## 2017-12-08 DIAGNOSIS — Z3689 Encounter for other specified antenatal screening: Secondary | ICD-10-CM

## 2017-12-08 DIAGNOSIS — Z3480 Encounter for supervision of other normal pregnancy, unspecified trimester: Secondary | ICD-10-CM

## 2017-12-08 DIAGNOSIS — Z3A19 19 weeks gestation of pregnancy: Secondary | ICD-10-CM

## 2017-12-08 DIAGNOSIS — Z3A20 20 weeks gestation of pregnancy: Secondary | ICD-10-CM | POA: Insufficient documentation

## 2017-12-08 DIAGNOSIS — Z3482 Encounter for supervision of other normal pregnancy, second trimester: Secondary | ICD-10-CM | POA: Insufficient documentation

## 2017-12-28 ENCOUNTER — Telehealth: Payer: Self-pay | Admitting: Family Medicine

## 2017-12-28 NOTE — Telephone Encounter (Signed)
Refill on RX promethazine

## 2017-12-29 ENCOUNTER — Ambulatory Visit (INDEPENDENT_AMBULATORY_CARE_PROVIDER_SITE_OTHER): Payer: Medicaid Other | Admitting: Obstetrics and Gynecology

## 2017-12-29 ENCOUNTER — Encounter: Payer: Self-pay | Admitting: Obstetrics and Gynecology

## 2017-12-29 VITALS — BP 104/79 | HR 79 | Wt 119.9 lb

## 2017-12-29 DIAGNOSIS — O210 Mild hyperemesis gravidarum: Secondary | ICD-10-CM

## 2017-12-29 DIAGNOSIS — Z3402 Encounter for supervision of normal first pregnancy, second trimester: Secondary | ICD-10-CM

## 2017-12-29 DIAGNOSIS — R12 Heartburn: Secondary | ICD-10-CM

## 2017-12-29 DIAGNOSIS — O26899 Other specified pregnancy related conditions, unspecified trimester: Secondary | ICD-10-CM

## 2017-12-29 MED ORDER — RANITIDINE HCL 150 MG PO TABS: 150.0000 mg | ORAL_TABLET | Freq: Two times a day (BID) | ORAL | 3 refills | Status: DC

## 2017-12-29 NOTE — Progress Notes (Signed)
   PRENATAL VISIT NOTE  Subjective:  Carla Haley is a 27 y.o. G3P2002 at 2264w6d being seen today for ongoing prenatal care.  She is currently monitored for the following issues for this low-risk pregnancy and has Post partum depression; Supervision of other normal pregnancy, antepartum; and Subchorionic hematoma on their problem list.  Patient reports heartburn, nausea and vomiting.  Contractions: Not present. Vag. Bleeding: None.  Movement: Present. Denies leaking of fluid.   The following portions of the patient's history were reviewed and updated as appropriate: allergies, current medications, past family history, past medical history, past social history, past surgical history and problem list. Problem list updated.  Objective:   Vitals:   12/29/17 1018  BP: 104/79  Pulse: 79  Weight: 119 lb 14.4 oz (54.4 kg)    Fetal Status: Fetal Heart Rate (bpm): 146 Fundal Height: 22 cm Movement: Present     General:  Alert, oriented and cooperative. Patient is in no acute distress.  Skin: Skin is warm and dry. No rash noted.   Cardiovascular: Normal heart rate noted  Respiratory: Normal respiratory effort, no problems with respiration noted  Abdomen: Soft, gravid, appropriate for gestational age.  Pain/Pressure: Present     Pelvic: Cervical exam deferred        Extremities: Normal range of motion.  Edema: None  Mental Status:  Normal mood and affect. Normal behavior. Normal judgment and thought content.   Assessment and Plan:  Pregnancy: G3P2002 at 6564w6d  Encounter for supervision of normal first pregnancy in second trimester  - US MFM OB FOLLOW UP -- ordered  Heartburn during pregnancy, antepartum - Rx for Zantac 150 mg BID  Morning sickness - Encouraged to increase water intake from 2 water bottles to 4 with a goal of 6-8 water bottles/day - Continue taking Phenergan as previously prescribed  Preterm labor symptoms and general obstetric precautions including but not limited to  vaginal bleeding, contractions, leaking of fluid and fetal movement were reviewed in detail with the patient. Please refer to After Visit Summary for other counseling recommendations.  Return in about 4 weeks (around 01/26/2018) for Return OB visit - after F/U U/S is done.   Raelyn Moraolitta Raynold Blankenbaker, CNM

## 2017-12-29 NOTE — Patient Instructions (Signed)
Second Trimester of Pregnancy The second trimester is from week 13 through week 28, month 4 through 6. This is often the time in pregnancy that you feel your best. Often times, morning sickness has lessened or quit. You may have more energy, and you may get hungry more often. Your unborn baby (fetus) is growing rapidly. At the end of the sixth month, he or she is about 9 inches long and weighs about 1 pounds. You will likely feel the baby move (quickening) between 18 and 20 weeks of pregnancy. Follow these instructions at home:  Avoid all smoking, herbs, and alcohol. Avoid drugs not approved by your doctor.  Do not use any tobacco products, including cigarettes, chewing tobacco, and electronic cigarettes. If you need help quitting, ask your doctor. You may get counseling or other support to help you quit.  Only take medicine as told by your doctor. Some medicines are safe and some are not during pregnancy.  Exercise only as told by your doctor. Stop exercising if you start having cramps.  Eat regular, healthy meals.  Wear a good support bra if your breasts are tender.  Do not use hot tubs, steam rooms, or saunas.  Wear your seat belt when driving.  Avoid raw meat, uncooked cheese, and liter boxes and soil used by cats.  Take your prenatal vitamins.  Take 1500-2000 milligrams of calcium daily starting at the 20th week of pregnancy until you deliver your baby.  Try taking medicine that helps you poop (stool softener) as needed, and if your doctor approves. Eat more fiber by eating fresh fruit, vegetables, and whole grains. Drink enough fluids to keep your pee (urine) clear or pale yellow.  Take warm water baths (sitz baths) to soothe pain or discomfort caused by hemorrhoids. Use hemorrhoid cream if your doctor approves.  If you have puffy, bulging veins (varicose veins), wear support hose. Raise (elevate) your feet for 15 minutes, 3-4 times a day. Limit salt in your diet.  Avoid heavy  lifting, wear low heals, and sit up straight.  Rest with your legs raised if you have leg cramps or low back pain.  Visit your dentist if you have not gone during your pregnancy. Use a soft toothbrush to brush your teeth. Be gentle when you floss.  You can have sex (intercourse) unless your doctor tells you not to.  Go to your doctor visits. Get help if:  You feel dizzy.  You have mild cramps or pressure in your lower belly (abdomen).  You have a nagging pain in your belly area.  You continue to feel sick to your stomach (nauseous), throw up (vomit), or have watery poop (diarrhea).  You have bad smelling fluid coming from your vagina.  You have pain with peeing (urination). Get help right away if:  You have a fever.  You are leaking fluid from your vagina.  You have spotting or bleeding from your vagina.  You have severe belly cramping or pain.  You lose or gain weight rapidly.  You have trouble catching your breath and have chest pain.  You notice sudden or extreme puffiness (swelling) of your face, hands, ankles, feet, or legs.  You have not felt the baby move in over an hour.  You have severe headaches that do not go away with medicine.  You have vision changes. This information is not intended to replace advice given to you by your health care provider. Make sure you discuss any questions you have with your health care   provider. Document Released: 01/13/2010 Document Revised: 03/26/2016 Document Reviewed: 12/20/2012 Elsevier Interactive Patient Education  2017 Elsevier Inc. Heartburn During Pregnancy Heartburn is pain or discomfort in the throat or chest. It may cause a burning feeling. It happens when stomach acid moves up into the tube that carries food from your mouth to your stomach (esophagus). Heartburn is common during pregnancy. It usually goes away or gets better after giving birth. Follow these instructions at home: Eating and drinking  Do not drink  alcohol while you are pregnant.  Figure out which foods and beverages make you feel worse, and avoid them.  Beverages that you may want to avoid include: ? Coffee and tea (with or without caffeine). ? Energy drinks and sports drinks. ? Bubbly (carbonated) drinks or sodas. ? Citrus fruit juices.  Foods that you may want to avoid include: ? Chocolate and cocoa. ? Peppermint and mint flavorings. ? Garlic, onions, and horseradish. ? Spicy and acidic foods. These include peppers, chili powder, curry powder, vinegar, hot sauces, and barbecue sauce. ? Citrus fruits, such as oranges, lemons, and limes. ? Tomato-based foods, such as red sauce, chili, and salsa. ? Fried and fatty foods, such as donuts, french fries, potato chips, and high-fat dressings. ? High-fat meats, such as hot dogs, cold cuts, sausage, ham, and bacon. ? High-fat dairy items, such as whole milk, butter, and cheese.  Eat small meals often, instead of large meals.  Avoid drinking a lot of liquid with your meals.  Avoid eating meals during the 2-3 hours before you go to bed.  Avoid lying down right after you eat.  Do not exercise right after you eat. Medicines  Take over-the-counter and prescription medicines only as told by your doctor.  Do not take aspirin, ibuprofen, or other NSAIDs unless your doctor tells you to do that.  Your doctor may tell you to avoid medicines that have sodium bicarbonate in them. General instructions  If told, raise the head of your bed about 6 inches (15 cm). You can do this by putting blocks under the legs. Sleeping with more pillows does not help with heartburn.  Do not use any products that contain nicotine or tobacco, such as cigarettes and e-cigarettes. If you need help quitting, ask your doctor.  Wear loose-fitting clothing.  Try to lower your stress, such as with yoga or meditation. If you need help, ask your doctor.  Stay at a healthy weight. If you are overweight, work with  your doctor to safely lose weight.  Keep all follow-up visits as told by your doctor. This is important. Contact a doctor if:  You get new symptoms.  Your symptoms do not get better with treatment.  You have weight loss and you do not know why.  You have trouble swallowing.  You make loud sounds when you breathe (wheeze).  You have a cough that does not go away.  You have heartburn often for more than 2 weeks.  You feel sick to your stomach (nauseous), and this does not get better with treatment.  You are throwing up (vomiting), and this does not get better with treatment.  You have pain in your belly (abdomen). Get help right away if:  You have very bad chest pain that spreads to your arm, neck, or jaw.  You feel sweaty, dizzy, or light-headed.  You have trouble breathing.  You have pain when swallowing.  You throw up and your throw-up looks like blood or coffee grounds.  Your poop (stool) is  bloody or black. This information is not intended to replace advice given to you by your health care provider. Make sure you discuss any questions you have with your health care provider. Document Released: 11/21/2010 Document Revised: 07/06/2016 Document Reviewed: 07/06/2016 Elsevier Interactive Patient Education  2017 Elsevier Inc. Eating Plan for Hyperemesis Gravidarum Hyperemesis gravidarum is a severe form of morning sickness. Because this condition causes severe nausea and vomiting, it can lead to dehydration, malnutrition, and weight loss. One way to lessen the symptoms of nausea and vomiting is to follow the eating plan for hyperemesis gravidarum. It is often used along with prescribed medicines to control your symptoms. What can I do to relieve my symptoms? Listen to your body. Everyone is different and has different preferences. Find what works best for you. Take any of the following actions that are helpful to you:  Eat and drink slowly.  Eat 5-6 small meals daily  instead of 3 large meals.  Eat crackers before you get out of bed in the morning.  Try having a snack in the middle of the night.  Starchy foods are usually tolerated well. Examples include cereal, toast, bread, potatoes, pasta, rice, and pretzels.  Ginger may help with nausea. Add  tsp ground ginger to hot tea or choose ginger tea.  Try drinking 100% fruit juice or an electrolyte drink. An electrolyte drink contains sodium, potassium, and chloride.  Continue to take your prenatal vitamins as told by your health care provider. If you are having trouble taking your prenatal vitamins, talk with your health care provider about different options.  Include at least 1 serving of protein with your meals and snacks. Protein options include meats or poultry, beans, nuts, eggs, and yogurt. Try eating a protein-rich snack before bed. Examples of these snacks include cheese and crackers or half of a peanut butter or [place] sandwich.  Consider eliminating foods that trigger your symptoms. These may include spicy foods, coffee, high-fat foods, very sweet foods, and acidic foods.  Try meals that have more protein combined with bland, salty, lower-fat, and dry foods, such as nuts, seeds, pretzels, crackers, and cereal.  Talk with your healthcare provider about starting a supplement of vitamin B6.  Have fluids that are cold, clear, and carbonated or sour. Examples include lemonade, ginger ale, lemon-lime soda, ice water, and sparkling water.  Try lemon or mint tea.  Try brushing your teeth or using a mouth rinse after meals.  What should I avoid to reduce my symptoms? Avoiding some of the following things may help reduce your symptoms.  Foods with strong smells. Try eating meals in well-ventilated areas that are free of odors.  Drinking water or other beverages with meals. Try not to drink anything during the 30 minutes before and after your meals.  Drinking more than 1 cup of fluid at a time.  Sometimes using a straw helps.  Fried or high-fat foods, such as butter and cream sauces.  Spicy foods.  Skipping meals as best as you can. Nausea can be more intense on an empty stomach. If you cannot tolerate food at that time, do not force it. Try sucking on ice chips or other frozen items, and make up for missed calories later.  Lying down within 2 hours after eating.  Environmental triggers. These may include smoky rooms, closed spaces, rooms with strong smells, warm or humid places, overly loud and noisy rooms, and rooms with motion or flickering lights.  Quick and sudden changes in your movement.  This information is not intended to replace advice given to you by your health care provider. Make sure you discuss any questions you have with your health care provider. Document Released: 08/16/2007 Document Revised: 06/17/2016 Document Reviewed: 05/19/2016 Elsevier Interactive Patient Education  2018 [employer]. Morning Sickness Morning sickness is when you feel sick to your stomach (nauseous) during pregnancy. You may feel sick to your stomach and throw up (vomit). You may feel sick in the morning, but you can feel this way any time of day. Some women feel very sick to their stomach and cannot stop throwing up (hyperemesis gravidarum). Follow these instructions at home:  Only take medicines as told by your doctor.  Take multivitamins as told by your doctor. Taking multivitamins before getting pregnant can stop or lessen the harshness of morning sickness.  Eat dry toast or unsalted crackers before getting out of bed.  Eat 5 to 6 small meals a day.  Eat dry and bland foods like rice and baked potatoes.  Do not drink liquids with meals. Drink between meals.  Do not eat greasy, fatty, or spicy foods.  Have someone cook for you if the smell of food causes you to feel sick or throw up.  If you feel sick to your stomach after taking prenatal vitamins, take them at night or with a  snack.  Eat protein when you need a snack (nuts, yogurt, cheese).  Eat unsweetened gelatins for dessert.  Wear a bracelet used for sea sickness (acupressure wristband).  Go to a doctor that puts thin needles into certain body points (acupuncture) to improve how you feel.  Do not smoke.  Use a humidifier to keep the air in your house free of odors.  Get lots of fresh air. Contact a doctor if:  You need medicine to feel better.  You feel dizzy or lightheaded.  You are losing weight. Get help right away if:  You feel very sick to your stomach and cannot stop throwing up.  You pass out (faint). This information is not intended to replace advice given to you by your health care provider. Make sure you discuss any questions you have with your health care provider. Document Released: 11/26/2004 Document Revised: 03/26/2016 Document Reviewed: 04/05/2013 Elsevier Interactive Patient Education  2017 [employer].

## 2018-01-12 ENCOUNTER — Other Ambulatory Visit (HOSPITAL_COMMUNITY): Payer: Self-pay | Admitting: *Deleted

## 2018-01-12 ENCOUNTER — Ambulatory Visit (HOSPITAL_COMMUNITY)
Admission: RE | Admit: 2018-01-12 | Discharge: 2018-01-12 | Disposition: A | Discharge status: Home / Self Care | Payer: Medicaid Other | Source: Ambulatory Visit | Attending: Obstetrics and Gynecology | Admitting: Obstetrics and Gynecology

## 2018-01-12 DIAGNOSIS — Z362 Encounter for other antenatal screening follow-up: Secondary | ICD-10-CM | POA: Insufficient documentation

## 2018-01-12 DIAGNOSIS — O368320 Maternal care for abnormalities of the fetal heart rate or rhythm, second trimester, not applicable or unspecified: Secondary | ICD-10-CM | POA: Diagnosis not present

## 2018-01-12 DIAGNOSIS — Z3A25 25 weeks gestation of pregnancy: Secondary | ICD-10-CM | POA: Insufficient documentation

## 2018-01-12 DIAGNOSIS — O368390 Maternal care for abnormalities of the fetal heart rate or rhythm, unspecified trimester, not applicable or unspecified: Secondary | ICD-10-CM

## 2018-01-12 DIAGNOSIS — Z3402 Encounter for supervision of normal first pregnancy, second trimester: Secondary | ICD-10-CM

## 2018-01-26 ENCOUNTER — Ambulatory Visit (INDEPENDENT_AMBULATORY_CARE_PROVIDER_SITE_OTHER): Payer: Medicaid Other | Admitting: Medical

## 2018-01-26 VITALS — BP 94/54 | HR 76 | Wt 126.1 lb

## 2018-01-26 DIAGNOSIS — O368390 Maternal care for abnormalities of the fetal heart rate or rhythm, unspecified trimester, not applicable or unspecified: Secondary | ICD-10-CM

## 2018-01-26 DIAGNOSIS — Z3480 Encounter for supervision of other normal pregnancy, unspecified trimester: Secondary | ICD-10-CM

## 2018-01-26 DIAGNOSIS — Z3482 Encounter for supervision of other normal pregnancy, second trimester: Secondary | ICD-10-CM

## 2018-01-26 DIAGNOSIS — O368320 Maternal care for abnormalities of the fetal heart rate or rhythm, second trimester, not applicable or unspecified: Secondary | ICD-10-CM

## 2018-01-26 NOTE — Progress Notes (Signed)
   PRENATAL VISIT NOTE  Subjective:  Carla Haley is a 27 y.o. G3P2002 at 3061w6d being seen today for ongoing prenatal care.  She is currently monitored for the following issues for this low-risk pregnancy and has Post partum depression; Supervision of other normal pregnancy, antepartum; Subchorionic hematoma; and Fetal arrhythmia affecting pregnancy, antepartum on their problem list.  Patient reports no complaints.  Contractions: Irritability. Vag. Bleeding: None.  Movement: Present. Denies leaking of fluid.   The following portions of the patient's history were reviewed and updated as appropriate: allergies, current medications, past family history, past medical history, past social history, past surgical history and problem list. Problem list updated.  Objective:   Vitals:   01/26/18 0924  BP: (!) 94/54  Pulse: 76  Weight: 126 lb 1.6 oz (57.2 kg)    Fetal Status: Fetal Heart Rate (bpm): 157 Fundal Height: 26 cm Movement: Present     General:  Alert, oriented and cooperative. Patient is in no acute distress.  Skin: Skin is warm and dry. No rash noted.   Cardiovascular: Normal heart rate noted  Respiratory: Normal respiratory effort, no problems with respiration noted  Abdomen: Soft, gravid, appropriate for gestational age.  Pain/Pressure: Present     Pelvic: Cervical exam deferred        Extremities: Normal range of motion.  Edema: None  Mental Status:  Normal mood and affect. Normal behavior. Normal judgment and thought content.   Assessment and Plan:  Pregnancy: G3P2002 at 2261w6d  1. Supervision of other normal pregnancy, antepartum - Patient is not fasting today, will return this week for fasting labs, 2 hour GTT, CBC, RPR and HIV - Tdap vaccine greater than or equal to 7yo IM  2. Fetal arrhythmia affecting pregnancy, antepartum - Repeat US scheduled 02/09/18 to assess premature atrial contractions, if no increased frequency likely benign and no further testing  required  Preterm labor symptoms and general obstetric precautions including but not limited to vaginal bleeding, contractions, leaking of fluid and fetal movement were reviewed in detail with the patient. Please refer to After Visit Summary for other counseling recommendations.  Return in about 2 weeks (around 02/09/2018) for LOB.   Vonzella NippleJulie Dalyah Pla, PA-C

## 2018-01-26 NOTE — Addendum Note (Signed)
Addended by: Henrietta DineNEAL, PAMELA S on: 01/26/2018 11:17 AM   Modules accepted: Orders

## 2018-01-26 NOTE — Patient Instructions (Signed)
Second Trimester of Pregnancy The second trimester is from week 13 through week 28, month 4 through 6. This is often the time in pregnancy that you feel your best. Often times, morning sickness has lessened or quit. You may have more energy, and you may get hungry more often. Your unborn baby (fetus) is growing rapidly. At the end of the sixth month, he or she is about 9 inches long and weighs about 1 pounds. You will likely feel the baby move (quickening) between 18 and 20 weeks of pregnancy. Follow these instructions at home:  Avoid all smoking, herbs, and alcohol. Avoid drugs not approved by your doctor.  Do not use any tobacco products, including cigarettes, chewing tobacco, and electronic cigarettes. If you need help quitting, ask your doctor. You may get counseling or other support to help you quit.  Only take medicine as told by your doctor. Some medicines are safe and some are not during pregnancy.  Exercise only as told by your doctor. Stop exercising if you start having cramps.  Eat regular, healthy meals.  Wear a good support bra if your breasts are tender.  Do not use hot tubs, steam rooms, or saunas.  Wear your seat belt when driving.  Avoid raw meat, uncooked cheese, and liter boxes and soil used by cats.  Take your prenatal vitamins.  Take 1500-2000 milligrams of calcium daily starting at the 20th week of pregnancy until you deliver your baby.  Try taking medicine that helps you poop (stool softener) as needed, and if your doctor approves. Eat more fiber by eating fresh fruit, vegetables, and whole grains. Drink enough fluids to keep your pee (urine) clear or pale yellow.  Take warm water baths (sitz baths) to soothe pain or discomfort caused by hemorrhoids. Use hemorrhoid cream if your doctor approves.  If you have puffy, bulging veins (varicose veins), wear support hose. Raise (elevate) your feet for 15 minutes, 3-4 times a day. Limit salt in your diet.  Avoid heavy  lifting, wear low heals, and sit up straight.  Rest with your legs raised if you have leg cramps or low back pain.  Visit your dentist if you have not gone during your pregnancy. Use a soft toothbrush to brush your teeth. Be gentle when you floss.  You can have sex (intercourse) unless your doctor tells you not to.  Go to your doctor visits. Get help if:  You feel dizzy.  You have mild cramps or pressure in your lower belly (abdomen).  You have a nagging pain in your belly area.  You continue to feel sick to your stomach (nauseous), throw up (vomit), or have watery poop (diarrhea).  You have bad smelling fluid coming from your vagina.  You have pain with peeing (urination). Get help right away if:  You have a fever.  You are leaking fluid from your vagina.  You have spotting or bleeding from your vagina.  You have severe belly cramping or pain.  You lose or gain weight rapidly.  You have trouble catching your breath and have chest pain.  You notice sudden or extreme puffiness (swelling) of your face, hands, ankles, feet, or legs.  You have not felt the baby move in over an hour.  You have severe headaches that do not go away with medicine.  You have vision changes. This information is not intended to replace advice given to you by your health care provider. Make sure you discuss any questions you have with your health care   provider. Document Released: 01/13/2010 Document Revised: 03/26/2016 Document Reviewed: 12/20/2012 Elsevier Interactive Patient Education  2017 [employer]  

## 2018-02-01 ENCOUNTER — Other Ambulatory Visit: Payer: Self-pay | Admitting: *Deleted

## 2018-02-01 DIAGNOSIS — Z3483 Encounter for supervision of other normal pregnancy, third trimester: Secondary | ICD-10-CM

## 2018-02-02 ENCOUNTER — Other Ambulatory Visit: Payer: Medicaid Other

## 2018-02-02 DIAGNOSIS — Z3483 Encounter for supervision of other normal pregnancy, third trimester: Secondary | ICD-10-CM

## 2018-02-03 LAB — GLUCOSE TOLERANCE, 2 HOURS W/ 1HR
GLUCOSE, 2 HOUR: 96 mg/dL (ref 65–152)
Glucose, 1 hour: 100 mg/dL (ref 65–179)
Glucose, Fasting: 67 mg/dL (ref 65–91)

## 2018-02-03 LAB — CBC
HEMOGLOBIN: 11.1 g/dL (ref 11.1–15.9)
Hematocrit: 33.1 % — ABNORMAL LOW (ref 34.0–46.6)
MCH: 30.2 pg (ref 26.6–33.0)
MCHC: 33.5 g/dL (ref 31.5–35.7)
MCV: 90 fL (ref 79–97)
Platelets: 246 10*3/uL (ref 150–379)
RBC: 3.67 x10E6/uL — ABNORMAL LOW (ref 3.77–5.28)
RDW: 12.2 % — ABNORMAL LOW (ref 12.3–15.4)
WBC: 5.6 10*3/uL (ref 3.4–10.8)

## 2018-02-03 LAB — RPR: RPR: NONREACTIVE

## 2018-02-03 LAB — HIV ANTIBODY (ROUTINE TESTING W REFLEX): HIV Screen 4th Generation wRfx: NONREACTIVE

## 2018-02-08 ENCOUNTER — Ambulatory Visit (INDEPENDENT_AMBULATORY_CARE_PROVIDER_SITE_OTHER): Payer: Medicaid Other | Admitting: Certified Nurse Midwife

## 2018-02-08 ENCOUNTER — Encounter: Payer: Self-pay | Admitting: Certified Nurse Midwife

## 2018-02-08 VITALS — BP 96/57 | HR 86 | Wt 125.0 lb

## 2018-02-08 DIAGNOSIS — Z3483 Encounter for supervision of other normal pregnancy, third trimester: Secondary | ICD-10-CM

## 2018-02-08 DIAGNOSIS — Z23 Encounter for immunization: Secondary | ICD-10-CM | POA: Diagnosis not present

## 2018-02-08 DIAGNOSIS — O368390 Maternal care for abnormalities of the fetal heart rate or rhythm, unspecified trimester, not applicable or unspecified: Secondary | ICD-10-CM

## 2018-02-08 DIAGNOSIS — Z3480 Encounter for supervision of other normal pregnancy, unspecified trimester: Secondary | ICD-10-CM

## 2018-02-08 NOTE — Progress Notes (Signed)
Tdap given in right arm at 1:55 on 02/08/18

## 2018-02-08 NOTE — Progress Notes (Signed)
   PRENATAL VISIT NOTE  Subjective:  Carla Haley is a 27 y.o. G3P2002 at 5146w5d being seen today for ongoing prenatal care.  She is currently monitored for the following issues for this low-risk pregnancy and has Post partum depression; Supervision of other normal pregnancy, antepartum; Subchorionic hematoma; and Fetal arrhythmia affecting pregnancy, antepartum on their problem list.  Patient reports no complaints.  Contractions: Irritability. Vag. Bleeding: None.  Movement: Present. Denies leaking of fluid.   The following portions of the patient's history were reviewed and updated as appropriate: allergies, current medications, past family history, past medical history, past social history, past surgical history and problem list. Problem list updated.  Objective:   Vitals:   02/08/18 1350  BP: (!) 96/57  Pulse: 86  Weight: 125 lb (56.7 kg)    Fetal Status: Fetal Heart Rate (bpm): 152 Fundal Height: 28 cm Movement: Present     General:  Alert, oriented and cooperative. Patient is in no acute distress.  Skin: Skin is warm and dry. No rash noted.   Cardiovascular: Normal heart rate noted  Respiratory: Normal respiratory effort, no problems with respiration noted  Abdomen: Soft, gravid, appropriate for gestational age.  Pain/Pressure: Present     Pelvic: Cervical exam deferred        Extremities: Normal range of motion.  Edema: None  Mental Status: Normal mood and affect. Normal behavior. Normal judgment and thought content.   Assessment and Plan:  Pregnancy: G3P2002 at 7846w5d  1. Supervision of other normal pregnancy, antepartum -Patient doing well, no complaints  -Reviewed results of third trimester lab work with patient  -Anticipatory guidance on upcoming prenatal appointments  - Tdap vaccine greater than or equal to 7yo IM  2. Fetal arrhythmia affecting pregnancy, antepartum -Follow up US scheduled for tomorrow  -Infrequent premature atrial contractions on previous US on  3/13   Preterm labor symptoms and general obstetric precautions including but not limited to vaginal bleeding, contractions, leaking of fluid and fetal movement were reviewed in detail with the patient. Please refer to After Visit Summary for other counseling recommendations.  Return in about 2 weeks (around 02/22/2018) for ROB.  Future Appointments  Date Time Provider Department Center  02/09/2018  8:30 AM [facility] US 1 [facility]  02/23/2018  8:55 AM Marny LowensteinWenzel, Julie N, PA-C [facility]  03/09/2018  8:15 AM Marny LowensteinWenzel, Julie N, PA-C [facility]  03/23/2018  8:55 AM Magnus SinningWenzel, Dimas AlexandriaJulie N, PA-C [facility]  03/30/2018  8:55 AM Magnus SinningWenzel, Dimas AlexandriaJulie N, PA-C [facility]    Sharyon CableVeronica C Talishia Betzler, CNM

## 2018-02-08 NOTE — Patient Instructions (Signed)

## 2018-02-09 ENCOUNTER — Other Ambulatory Visit (HOSPITAL_COMMUNITY): Payer: Self-pay | Admitting: Maternal and Fetal Medicine

## 2018-02-09 ENCOUNTER — Ambulatory Visit (HOSPITAL_COMMUNITY)
Admission: RE | Admit: 2018-02-09 | Discharge: 2018-02-09 | Disposition: A | Discharge status: Home / Self Care | Payer: Medicaid Other | Source: Ambulatory Visit | Attending: Obstetrics and Gynecology | Admitting: Obstetrics and Gynecology

## 2018-02-09 ENCOUNTER — Encounter (HOSPITAL_COMMUNITY): Payer: Self-pay

## 2018-02-09 DIAGNOSIS — Z362 Encounter for other antenatal screening follow-up: Secondary | ICD-10-CM | POA: Insufficient documentation

## 2018-02-09 DIAGNOSIS — O368330 Maternal care for abnormalities of the fetal heart rate or rhythm, third trimester, not applicable or unspecified: Secondary | ICD-10-CM | POA: Diagnosis present

## 2018-02-09 DIAGNOSIS — O368390 Maternal care for abnormalities of the fetal heart rate or rhythm, unspecified trimester, not applicable or unspecified: Secondary | ICD-10-CM

## 2018-02-09 DIAGNOSIS — Z3A29 29 weeks gestation of pregnancy: Secondary | ICD-10-CM

## 2018-02-23 ENCOUNTER — Encounter: Payer: Self-pay | Admitting: Medical

## 2018-02-23 ENCOUNTER — Ambulatory Visit (INDEPENDENT_AMBULATORY_CARE_PROVIDER_SITE_OTHER): Payer: Medicaid Other | Admitting: Medical

## 2018-02-23 VITALS — BP 102/59 | HR 80 | Wt 127.7 lb

## 2018-02-23 DIAGNOSIS — Z3480 Encounter for supervision of other normal pregnancy, unspecified trimester: Secondary | ICD-10-CM

## 2018-02-23 DIAGNOSIS — O368390 Maternal care for abnormalities of the fetal heart rate or rhythm, unspecified trimester, not applicable or unspecified: Secondary | ICD-10-CM

## 2018-02-23 NOTE — Progress Notes (Signed)
   PRENATAL VISIT NOTE  Subjective:  Carla Haley is a 27 y.o. G3P2002 at 7023w6d being seen today for ongoing prenatal care.  She is currently monitored for the following issues for this low-risk pregnancy and has Post partum depression; Supervision of other normal pregnancy, antepartum; Subchorionic hematoma; and Fetal arrhythmia affecting pregnancy, antepartum on their problem list.  Patient reports no complaints.  Contractions: Not present. Vag. Bleeding: None.  Movement: Present. Denies leaking of fluid.   The following portions of the patient's history were reviewed and updated as appropriate: allergies, current medications, past family history, past medical history, past social history, past surgical history and problem list. Problem list updated.  Objective:   Vitals:   02/23/18 0907  BP: (!) 102/59  Pulse: 80  Weight: 127 lb 11.2 oz (57.9 kg)    Fetal Status: Fetal Heart Rate (bpm): 140 Fundal Height: 31 cm Movement: Present     General:  Alert, oriented and cooperative. Patient is in no acute distress.  Skin: Skin is warm and dry. No rash noted.   Cardiovascular: Normal heart rate noted  Respiratory: Normal respiratory effort, no problems with respiration noted  Abdomen: Soft, gravid, appropriate for gestational age.  Pain/Pressure: Absent     Pelvic: Cervical exam deferred        Extremities: Normal range of motion.  Edema: None  Mental Status: Normal mood and affect. Normal behavior. Normal judgment and thought content.   Assessment and Plan:  Pregnancy: G3P2002 at 3723w6d  1. Supervision of other normal pregnancy, antepartum - Doing well, heartburn well controlled with Zantac, confirmed refills still available - Pelvic pressure noted with activity, advised to try abdominal binder   2. Fetal arrhythmia affecting pregnancy, antepartum - Not noted on last US, no further US evaluation recommended   Preterm labor symptoms and general obstetric precautions including but  not limited to vaginal bleeding, contractions, leaking of fluid and fetal movement were reviewed in detail with the patient. Please refer to After Visit Summary for other counseling recommendations.  Return in about 2 weeks (around 03/09/2018) for LOB.  Future Appointments  Date Time Provider Department Center  03/09/2018  8:15 AM Kathlene CoteWenzel, Carry Weesner N, [facility]-C [facility]  03/23/2018  8:55 AM Magnus SinningWenzel, Dimas AlexandriaJulie N, PA-C [facility]  03/30/2018  8:55 AM Marny LowensteinWenzel, Cheyanna Strick N, PA-C [facility]    Vonzella NippleJulie Mada Sadik, PA-C

## 2018-02-23 NOTE — Patient Instructions (Signed)

## 2018-03-09 ENCOUNTER — Encounter: Payer: Self-pay | Admitting: Medical

## 2018-03-09 ENCOUNTER — Ambulatory Visit (INDEPENDENT_AMBULATORY_CARE_PROVIDER_SITE_OTHER): Payer: Medicaid Other | Admitting: Medical

## 2018-03-09 VITALS — BP 102/58 | HR 92 | Wt 127.0 lb

## 2018-03-09 DIAGNOSIS — Z3480 Encounter for supervision of other normal pregnancy, unspecified trimester: Secondary | ICD-10-CM

## 2018-03-09 DIAGNOSIS — O368390 Maternal care for abnormalities of the fetal heart rate or rhythm, unspecified trimester, not applicable or unspecified: Secondary | ICD-10-CM

## 2018-03-09 NOTE — Progress Notes (Signed)
   PRENATAL VISIT NOTE  Subjective:  Carla Haley is a 27 y.o. G3P2002 at [redacted]w[redacted]d being seen today for ongoing prenatal care.  She is currently monitored for the following issues for this low-risk pregnancy and has Post partum depression; Supervision of other normal pregnancy, antepartum; Subchorionic hematoma; and Fetal arrhythmia affecting pregnancy, antepartum on their problem list.  Patient reports occasional contractions.  Contractions: Irritability. Vag. Bleeding: None.  Movement: Present. Denies leaking of fluid.   The following portions of the patient's history were reviewed and updated as appropriate: allergies, current medications, past family history, past medical history, past social history, past surgical history and problem list. Problem list updated.  Objective:   Vitals:   03/09/18 0903  BP: (!) 102/58  Pulse: 92  Weight: 127 lb (57.6 kg)    Fetal Status: Fetal Heart Rate (bpm): 145 Fundal Height: 32 cm Movement: Present     General:  Alert, oriented and cooperative. Patient is in no acute distress.  Skin: Skin is warm and dry. No rash noted.   Cardiovascular: Normal heart rate noted  Respiratory: Normal respiratory effort, no problems with respiration noted  Abdomen: Soft, gravid, appropriate for gestational age.  Pain/Pressure: Absent     Pelvic: Cervical exam deferred        Extremities: Normal range of motion.  Edema: None  Mental Status: Normal mood and affect. Normal behavior. Normal judgment and thought content.   Assessment and Plan:  Pregnancy: G3P2002 at [redacted]w[redacted]d  1. Supervision of other normal pregnancy, antepartum - Doing well, only few occasional contractions, denies VB, LOF, +FM  2. Fetal arrhythmia affecting pregnancy, antepartum - Resolved on last [place] - No follow-up indicated  Preterm labor symptoms and general obstetric precautions including but not limited to vaginal bleeding, contractions, leaking of fluid and fetal movement were reviewed in  detail with the patient. Please refer to After Visit Summary for other counseling recommendations.  Return in about 2 weeks (around 03/23/2018) for LOB.  Future Appointments  Date Time Provider Department Center  03/23/2018  8:55 AM Kathlene Cote [facility]  03/30/2018  8:55 AM Magnus Sinning, Dimas Alexandria, PA-C [facility]    Vonzella Nipple, PA-C

## 2018-03-09 NOTE — Patient Instructions (Signed)

## 2018-03-23 ENCOUNTER — Other Ambulatory Visit (HOSPITAL_COMMUNITY)
Admission: RE | Admit: 2018-03-23 | Discharge: 2018-03-23 | Disposition: A | Discharge status: Home / Self Care | Payer: Medicaid Other | Source: Ambulatory Visit | Attending: Medical | Admitting: Medical

## 2018-03-23 ENCOUNTER — Ambulatory Visit (INDEPENDENT_AMBULATORY_CARE_PROVIDER_SITE_OTHER): Payer: Medicaid Other | Admitting: Medical

## 2018-03-23 VITALS — BP 108/61 | HR 87 | Wt 131.1 lb

## 2018-03-23 DIAGNOSIS — Z3480 Encounter for supervision of other normal pregnancy, unspecified trimester: Secondary | ICD-10-CM | POA: Diagnosis present

## 2018-03-23 DIAGNOSIS — O368390 Maternal care for abnormalities of the fetal heart rate or rhythm, unspecified trimester, not applicable or unspecified: Secondary | ICD-10-CM

## 2018-03-23 DIAGNOSIS — Z3483 Encounter for supervision of other normal pregnancy, third trimester: Secondary | ICD-10-CM

## 2018-03-23 LAB — OB RESULTS CONSOLE GBS: GBS: NEGATIVE

## 2018-03-23 NOTE — Patient Instructions (Signed)
Vaginal delivery means that you will give birth by pushing your baby out of your birth canal (vagina). A team of health care providers will help you before, during, and after vaginal delivery. Birth experiences are unique for every woman and every pregnancy, and birth experiences vary depending on where you choose to give birth. What should I do to prepare for my baby's birth? Before your baby is born, it is important to talk with your health care provider about:  Your labor and delivery preferences. These may include: ? Medicines that you may be given. ? How you will manage your pain. This might include non-medical pain relief techniques or injectable pain relief such as epidural analgesia. ? How you and your baby will be monitored during labor and delivery. ? Who may be in the labor and delivery room with you. ? Your feelings about surgical delivery of your baby (cesarean delivery, or C-section) if this becomes necessary. ? Your feelings about receiving donated blood through an IV tube (blood transfusion) if this becomes necessary.  Whether you are able: ? To take pictures or videos of the birth. ? To eat during labor and delivery. ? To move around, walk, or change positions during labor and delivery.  What to expect after your baby is born, such as: ? Whether delayed umbilical cord clamping and cutting is offered. ? Who will care for your baby right after birth. ? Medicines or tests that may be recommended for your baby. ? Whether breastfeeding is supported in your hospital or birth center. ? How long you will be in the hospital or birth center.  How any medical conditions you have may affect your baby or your labor and delivery experience.  To prepare for your baby's birth, you should also:  Attend all of your health care visits before delivery (prenatal visits) as recommended by your health care provider. This is important.  Prepare your home for your baby's arrival. Make sure  that you have: ? Diapers. ? Baby clothing. ? Feeding equipment. ? Safe sleeping arrangements for you and your baby.  Install a car seat in your vehicle. Have your car seat checked by a certified car seat installer to make sure that it is installed safely.  Think about who will help you with your new baby at home for at least the first several weeks after delivery.  What can I expect when I arrive at the birth center or hospital? Once you are in labor and have been admitted into the hospital or birth center, your health care provider may:  Review your pregnancy history and any concerns you have.  Insert an IV tube into one of your veins. This is used to give you fluids and medicines.  Check your blood pressure, pulse, temperature, and heart rate (vital signs).  Check whether your bag of water (amniotic sac) has broken (ruptured).  Talk with you about your birth plan and discuss pain control options.  Monitoring Your health care provider may monitor your contractions (uterine monitoring) and your baby's heart rate (fetal monitoring). You may need to be monitored:  Often, but not continuously (intermittently).  All the time or for long periods at a time (continuously). Continuous monitoring may be needed if: ? You are taking certain medicines, such as medicine to relieve pain or make your contractions stronger. ? You have pregnancy or labor complications.  Monitoring may be done by:  Placing a special stethoscope or a handheld monitoring device on your abdomen to check your   baby's heartbeat, and feeling your abdomen for contractions. This method of monitoring does not continuously record your baby's heartbeat or your contractions.  Placing monitors on your abdomen (external monitors) to record your baby's heartbeat and the frequency and length of contractions. You may not have to wear external monitors all the time.  Placing monitors inside of your uterus (internal monitors) to  record your baby's heartbeat and the frequency, length, and strength of your contractions. ? Your health care provider may use internal monitors if he or she needs more information about the strength of your contractions or your baby's heart rate. ? Internal monitors are put in place by passing a thin, flexible wire through your vagina and into your uterus. Depending on the type of monitor, it may remain in your uterus or on your baby's head until birth. ? Your health care provider will discuss the benefits and risks of internal monitoring with you and will ask for your permission before inserting the monitors.  Telemetry. This is a type of continuous monitoring that can be done with external or internal monitors. Instead of having to stay in bed, you are able to move around during telemetry. Ask your health care provider if telemetry is an option for you.  Physical exam Your health care provider may perform a physical exam. This may include:  Checking whether your baby is positioned: ? With the head toward your vagina (head-down). This is most common. ? With the head toward the top of your uterus (head-up or breech). If your baby is in a breech position, your health care provider may try to turn your baby to a head-down position so you can deliver vaginally. If it does not seem that your baby can be born vaginally, your provider may recommend surgery to deliver your baby. In rare cases, you may be able to deliver vaginally if your baby is head-up (breech delivery). ? Lying sideways (transverse). Babies that are lying sideways cannot be delivered vaginally.  Checking your cervix to determine: ? Whether it is thinning out (effacing). ? Whether it is opening up (dilating). ? How low your baby has moved into your birth canal.  What are the three stages of labor and delivery?  Normal labor and delivery is divided into the following three stages: Stage 1  Stage 1 is the longest stage of labor,  and it can last for hours or days. Stage 1 includes: ? Early labor. This is when contractions may be irregular, or regular and mild. Generally, early labor contractions are more than 10 minutes apart. ? Active labor. This is when contractions get longer, more regular, more frequent, and more intense. ? The transition phase. This is when contractions happen very close together, are very intense, and may last longer than during any other part of labor.  Contractions generally feel mild, infrequent, and irregular at first. They get stronger, more frequent (about every 2-3 minutes), and more regular as you progress from early labor through active labor and transition.  Many women progress through stage 1 naturally, but you may need help to continue making progress. If this happens, your health care provider may talk with you about: ? Rupturing your amniotic sac if it has not ruptured yet. ? Giving you medicine to help make your contractions stronger and more frequent.  Stage 1 ends when your cervix is completely dilated to 4 inches (10 cm) and completely effaced. This happens at the end of the transition phase. Stage 2  Once your cervix   is completely effaced and dilated to 4 inches (10 cm), you may start to feel an urge to push. It is common for the body to naturally take a rest before feeling the urge to push, especially if you received an epidural or certain other pain medicines. This rest period may last for up to 1-2 hours, depending on your unique labor experience.  During stage 2, contractions are generally less painful, because pushing helps relieve contraction pain. Instead of contraction pain, you may feel stretching and burning pain, especially when the widest part of your baby's head passes through the vaginal opening (crowning).  Your health care provider will closely monitor your pushing progress and your baby's progress through the vagina during stage 2.  Your health care provider may  massage the area of skin between your vaginal opening and anus (perineum) or apply warm compresses to your perineum. This helps it stretch as the baby's head starts to crown, which can help prevent perineal tearing. ? In some cases, an incision may be made in your perineum (episiotomy) to allow the baby to pass through the vaginal opening. An episiotomy helps to make the opening of the vagina larger to allow more room for the baby to fit through.  It is very important to breathe and focus so your health care provider can control the delivery of your baby's head. Your health care provider may have you decrease the intensity of your pushing, to help prevent perineal tearing.  After delivery of your baby's head, the shoulders and the rest of the body generally deliver very quickly and without difficulty.  Once your baby is delivered, the umbilical cord may be cut right away, or this may be delayed for 1-2 minutes, depending on your baby's health. This may vary among health care providers, hospitals, and birth centers.  If you and your baby are healthy enough, your baby may be placed on your chest or abdomen to help maintain the baby's temperature and to help you bond with each other. Some mothers and babies start breastfeeding at this time. Your health care team will dry your baby and help keep your baby warm during this time.  Your baby may need immediate care if he or she: ? Showed signs of distress during labor. ? Has a medical condition. ? Was born too early (prematurely). ? Had a bowel movement before birth (meconium). ? Shows signs of difficulty transitioning from being inside the uterus to being outside of the uterus. If you are planning to breastfeed, your health care team will help you begin a feeding. Stage 3  The third stage of labor starts immediately after the birth of your baby and ends after you deliver the placenta. The placenta is an organ that develops during pregnancy to provide  oxygen and nutrients to your baby in the womb.  Delivering the placenta may require some pushing, and you may have mild contractions. Breastfeeding can stimulate contractions to help you deliver the placenta.  After the placenta is delivered, your uterus should tighten (contract) and become firm. This helps to stop bleeding in your uterus. To help your uterus contract and to control bleeding, your health care provider may: ? Give you medicine by injection, through an IV tube, by mouth, or through your rectum (rectally). ? Massage your abdomen or perform a vaginal exam to remove any blood clots that are left in your uterus. ? Empty your bladder by placing a thin, flexible tube (catheter) into your bladder. ? Encourage you to   breastfeed your baby. After labor is over, you and your baby will be monitored closely to ensure that you are both healthy until you are ready to go home. Your health care team will teach you how to care for yourself and your baby. This information is not intended to replace advice given to you by your health care provider. Make sure you discuss any questions you have with your health care provider. Document Released: 07/28/2008 Document Revised: 05/08/2016 Document Reviewed: 11/03/2015 Elsevier Interactive Patient Education  2018 [employer]  

## 2018-03-23 NOTE — Progress Notes (Signed)
   PRENATAL VISIT NOTE  Subjective:  Carla Haley is a 27 y.o. G3P2002 at [redacted]w[redacted]d being seen today for ongoing prenatal care.  She is currently monitored for the following issues for this low-risk pregnancy and has Post partum depression; Supervision of other normal pregnancy, antepartum; Subchorionic hematoma; and Fetal arrhythmia affecting pregnancy, antepartum on their problem list.  Patient reports no complaints. States she is feeling tired but excited about birth of her son.  Contractions: Irregular. Vag. Bleeding: None.  Movement: Present. Denies leaking of fluid.  The following portions of the patient's history were reviewed and updated as appropriate: allergies, current medications, past family history, past medical history, past social history, past surgical history and problem list. Problem list updated.  Objective:   Vitals:   03/23/18 0926  BP: 108/61  Pulse: 87  Weight: 131 lb 1.6 oz (59.5 kg)    Fetal Status: Fetal Heart Rate (bpm): 144 Fundal Height: 36 cm Movement: Present     General:  Alert, oriented and cooperative. Patient is in no acute distress.  Skin: Skin is warm and dry. No rash noted.   Cardiovascular: Normal heart rate noted  Respiratory: Normal respiratory effort, no problems with respiration noted  Abdomen: Soft, gravid, appropriate for gestational age.  Pain/Pressure: Present     Pelvic: Cervical exam performed closed/thick/posterior        Extremities: Normal range of motion.  Edema: None  Mental Status: Normal mood and affect. Normal behavior. Normal judgment and thought content.   Assessment and Plan:  Pregnancy: G3P2002 at [redacted]w[redacted]d  1. Supervision of other normal pregnancy, antepartum - No complaints, continue routine care  2. Encounter for supervision of other normal pregnancy in third trimester - Return to clinic in one week (03/30/2018) - Discussed signs of labor (see patient information in AVS) - Culture, beta strep (group b only) -  Cervicovaginal ancillary only   3. Fetal arrhythmia affecting pregnancy, antepartum - Resolved per followup US on 02/09/18  Preterm labor symptoms and general obstetric precautions including but not limited to vaginal bleeding, contractions, leaking of fluid and fetal movement were reviewed in detail with the patient. Please refer to After Visit Summary for other counseling recommendations.  Return in about 1 week (around 03/30/2018).  Future Appointments  Date Time Provider Department Center  03/30/2018  8:55 AM Marny Lowenstein, PA-C [facility]    [facility] [place], [place]  03/23/18 9:43 AM

## 2018-03-24 LAB — CERVICOVAGINAL ANCILLARY ONLY
CHLAMYDIA, DNA PROBE: NEGATIVE
NEISSERIA GONORRHEA: NEGATIVE

## 2018-03-27 LAB — CULTURE, BETA STREP (GROUP B ONLY): Strep Gp B Culture: NEGATIVE

## 2018-03-29 ENCOUNTER — Ambulatory Visit (INDEPENDENT_AMBULATORY_CARE_PROVIDER_SITE_OTHER): Payer: Medicaid Other | Admitting: General Practice

## 2018-03-29 ENCOUNTER — Other Ambulatory Visit (HOSPITAL_COMMUNITY)
Admission: RE | Admit: 2018-03-29 | Discharge: 2018-03-29 | Disposition: A | Discharge status: Home / Self Care | Payer: Medicaid Other | Source: Ambulatory Visit | Attending: Family Medicine | Admitting: Family Medicine

## 2018-03-29 DIAGNOSIS — N898 Other specified noninflammatory disorders of vagina: Secondary | ICD-10-CM | POA: Insufficient documentation

## 2018-03-29 DIAGNOSIS — R300 Dysuria: Secondary | ICD-10-CM | POA: Diagnosis not present

## 2018-03-29 DIAGNOSIS — B373 Candidiasis of vulva and vagina: Secondary | ICD-10-CM | POA: Insufficient documentation

## 2018-03-29 LAB — POCT URINALYSIS DIP (DEVICE)
Bilirubin Urine: NEGATIVE
GLUCOSE, UA: NEGATIVE mg/dL
Hgb urine dipstick: NEGATIVE
Ketones, ur: NEGATIVE mg/dL
Nitrite: NEGATIVE
PH: 7 (ref 5.0–8.0)
PROTEIN: NEGATIVE mg/dL
Specific Gravity, Urine: 1.015 (ref 1.005–1.030)
UROBILINOGEN UA: 0.2 mg/dL (ref 0.0–1.0)

## 2018-03-29 NOTE — Progress Notes (Signed)
Patient presents to office today reporting intense vaginal itching/irritation. Patient also reports dysuria which makes her afraid to urinate because it hurts so badly. Patient states she doesn't know if she is having discharge or if she is occasionally peeing on herself. Patient reports itching has been going on for a couple weeks now but has worsened. Patient instructed in self swab and specimen collected. UA is not conclusive, will send for culture. Discussed with patient we will notify her with any abnormal results. Patient verbalized understanding & had no questions at this time.

## 2018-03-29 NOTE — Progress Notes (Signed)
I have reviewed the chart and agree with nursing staff's documentation of this patient's encounter.  Vonzella Nipple, PA-C 03/29/2018 1:49 PM

## 2018-03-30 ENCOUNTER — Ambulatory Visit (INDEPENDENT_AMBULATORY_CARE_PROVIDER_SITE_OTHER): Payer: Medicaid Other | Admitting: Medical

## 2018-03-30 ENCOUNTER — Encounter: Payer: Self-pay | Admitting: Medical

## 2018-03-30 VITALS — BP 104/59 | HR 87 | Wt 130.6 lb

## 2018-03-30 DIAGNOSIS — Z3480 Encounter for supervision of other normal pregnancy, unspecified trimester: Secondary | ICD-10-CM

## 2018-03-30 DIAGNOSIS — N898 Other specified noninflammatory disorders of vagina: Secondary | ICD-10-CM

## 2018-03-30 DIAGNOSIS — O368390 Maternal care for abnormalities of the fetal heart rate or rhythm, unspecified trimester, not applicable or unspecified: Secondary | ICD-10-CM

## 2018-03-30 LAB — CERVICOVAGINAL ANCILLARY ONLY
BACTERIAL VAGINITIS: NEGATIVE
Candida vaginitis: POSITIVE — AB
Trichomonas: NEGATIVE

## 2018-03-30 MED ORDER — TERCONAZOLE 0.8 % VA CREA: 1.0000 | TOPICAL_CREAM | Freq: Every day | VAGINAL | 0 refills | Status: DC

## 2018-03-30 NOTE — Progress Notes (Signed)
   PRENATAL VISIT NOTE  Subjective:  Carla Haley is a 27 y.o. G3P2002 at [redacted]w[redacted]d being seen today for ongoing prenatal care.  She is currently monitored for the following issues for this low-risk pregnancy and has Post partum depression; Supervision of other normal pregnancy, antepartum; Subchorionic hematoma; and Fetal arrhythmia affecting pregnancy, antepartum on their problem list.  Patient reports occasional contractions.  Contractions: Irregular. Vag. Bleeding: None.  Movement: Present. Denies leaking of fluid.   The following portions of the patient's history were reviewed and updated as appropriate: allergies, current medications, past family history, past medical history, past social history, past surgical history and problem list. Problem list updated.  Objective:   Vitals:   03/30/18 0932  BP: (!) 104/59  Pulse: 87  Weight: 130 lb 9.6 oz (59.2 kg)    Fetal Status: Fetal Heart Rate (bpm): 136 Fundal Height: 36 cm Movement: Present     General:  Alert, oriented and cooperative. Patient is in no acute distress.  Skin: Skin is warm and dry. No rash noted.   Cardiovascular: Normal heart rate noted  Respiratory: Normal respiratory effort, no problems with respiration noted  Abdomen: Soft, gravid, appropriate for gestational age.  Pain/Pressure: Present     Pelvic: Cervical exam deferred        Extremities: Normal range of motion.  Edema: None  Mental Status: Normal mood and affect. Normal behavior. Normal judgment and thought content.   Assessment and Plan:  Pregnancy: G3P2002 at [redacted]w[redacted]d  1. Supervision of other normal pregnancy, antepartum - Reviewed negative GBS  2. Fetal arrhythmia affecting pregnancy, antepartum - not seen at last [place], no further evaluation needed  3. Vaginal itching - terconazole (TERAZOL 3) 0.8 % vaginal cream; Place 1 applicator vaginally at bedtime.  Dispense: 20 g; Refill: 0 - Did self swab yesterday, but results are pending, treating today based  on symptoms, will await results for any additional treatment if needed   Preterm labor symptoms and general obstetric precautions including but not limited to vaginal bleeding, contractions, leaking of fluid and fetal movement were reviewed in detail with the patient. Please refer to After Visit Summary for other counseling recommendations.  Return in about 1 week (around 04/06/2018) for LOB.  Vonzella Nipple, PA-C

## 2018-03-30 NOTE — Progress Notes (Signed)
Elevated phq9- declines bhc.

## 2018-03-30 NOTE — Patient Instructions (Signed)

## 2018-03-31 LAB — URINE CULTURE, OB REFLEX

## 2018-03-31 LAB — CULTURE, OB URINE

## 2018-04-05 ENCOUNTER — Encounter: Payer: Self-pay | Admitting: Student

## 2018-04-06 ENCOUNTER — Encounter: Payer: Self-pay | Admitting: Student

## 2018-04-06 ENCOUNTER — Ambulatory Visit (INDEPENDENT_AMBULATORY_CARE_PROVIDER_SITE_OTHER): Payer: Medicaid Other | Admitting: Student

## 2018-04-06 VITALS — BP 113/66 | HR 95 | Wt 134.8 lb

## 2018-04-06 DIAGNOSIS — Z3480 Encounter for supervision of other normal pregnancy, unspecified trimester: Secondary | ICD-10-CM

## 2018-04-06 NOTE — Progress Notes (Signed)
   PRENATAL VISIT NOTE  Subjective:  Carla Haley is a 27 y.o. G3P2002 at 6031w6d being seen today for ongoing prenatal care.  She is currently monitored for the following issues for this low-risk pregnancy and has Post partum depression; Supervision of other normal pregnancy, antepartum; and Fetal arrhythmia affecting pregnancy, antepartum on their problem list.  Patient reports occasional contractions.  Contractions: Irregular. Vag. Bleeding: None.  Movement: Present. Denies leaking of fluid.   The following portions of the patient's history were reviewed and updated as appropriate: allergies, current medications, past family history, past medical history, past social history, past surgical history and problem list. Problem list updated.  Objective:   Vitals:   04/06/18 0918  BP: 113/66  Pulse: 95  Weight: 134 lb 12.8 oz (61.1 kg)    Fetal Status: Fetal Heart Rate (bpm): 153 Fundal Height: 38 cm Movement: Present  Presentation: Vertex  General:  Alert, oriented and cooperative. Patient is in no acute distress.  Skin: Skin is warm and dry. No rash noted.   Cardiovascular: Normal heart rate noted  Respiratory: Normal respiratory effort, no problems with respiration noted  Abdomen: Soft, gravid, appropriate for gestational age.  Pain/Pressure: Present     Pelvic: Cervical exam performed Dilation: 2 Effacement (%): 40 Station: -3  Extremities: Normal range of motion.  Edema: None  Mental Status: Normal mood and affect. Normal behavior. Normal judgment and thought content.   Assessment and Plan:  Pregnancy: G3P2002 at 5731w6d  1. Supervision of other normal pregnancy, antepartum -doing well. Occasional contractions. Reviewed labor precautions.   Term labor symptoms and general obstetric precautions including but not limited to vaginal bleeding, contractions, leaking of fluid and fetal movement were reviewed in detail with the patient. Please refer to After Visit Summary for other  counseling recommendations.  Return in about 1 week (around 04/13/2018) for Routine OB.  No future appointments.  Judeth HornErin Christabelle Hanzlik, NP

## 2018-04-06 NOTE — Patient Instructions (Signed)
Before Baby Comes Home  Before your baby arrives it is important to:   Have all of the supplies that you will need to care for your baby.   Know where to go if there is an emergency.   Discuss the baby's arrival with other family members.    What supplies will I need?    It is recommended that you have the following supplies:  Large Items   Crib.   Crib mattress.   Rear-facing infant car seat. If possible, have a trained professional check to make sure that it is installed correctly.    Feeding   6-8 bottles that are 4-5 oz in size.   6-8 nipples.   Bottle brush.   Sterilizer, or a large pan or kettle with a lid.   A way to boil and cool water.   If you will be breastfeeding:  ? Breast pump.  ? Nipple cream.  ? Nursing bra.  ? Breast pads.  ? Breast shields.   If you will be formula feeding:  ? Formula.  ? Measuring cups.  ? Measuring spoons.    Bathing   Mild baby soap and baby shampoo.   Petroleum jelly.   Soft cloth towel and washcloth.   Hooded towel.   Cotton balls.   Bath basin.    Other Supplies   Rectal thermometer.   Bulb syringe.   Baby wipes or washcloths for diaper changes.   Diaper bag.   Changing pad.   Clothing, including one-piece outfits and pajamas.   Baby nail clippers.   Receiving blankets.   Mattress pad and sheets for the crib.   Night-light for the baby's room.   Baby monitor.   2 or 3 pacifiers.   Either 24-36 cloth diapers and waterproof diaper covers or a box of disposable diapers. You may need to use as many as 10-12 diapers per day.    How do I prepare for an emergency?  Prepare for an emergency by:   Knowing how to get to the nearest hospital.   Listing the phone numbers of your baby's health care providers near your home phone and in your cell phone.    How do I prepare my family?   Decide how to handle visitors.   If you have other children:  ? Talk with them about the baby coming home. Ask them how they feel about it.  ? Read a book together about  being a new big brother or sister.  ? Find ways to let them help you prepare for the new baby.  ? Have someone ready to care for them while you are in the hospital.  This information is not intended to replace advice given to you by your health care provider. Make sure you discuss any questions you have with your health care provider.  Document Released: 10/01/2008 Document Revised: 03/26/2016 Document Reviewed: 09/26/2014  Elsevier Interactive Patient Education  2018 [employer]

## 2018-04-07 ENCOUNTER — Inpatient Hospital Stay (HOSPITAL_COMMUNITY)
Admission: AD | Admit: 2018-04-07 | Discharge: 2018-04-07 | Disposition: A | Discharge status: Home / Self Care | Payer: Medicaid Other | Source: Ambulatory Visit | Attending: Obstetrics & Gynecology | Admitting: Obstetrics & Gynecology

## 2018-04-07 ENCOUNTER — Encounter (HOSPITAL_COMMUNITY): Payer: Self-pay | Admitting: *Deleted

## 2018-04-07 DIAGNOSIS — Z3A00 Weeks of gestation of pregnancy not specified: Secondary | ICD-10-CM | POA: Insufficient documentation

## 2018-04-07 DIAGNOSIS — O479 False labor, unspecified: Secondary | ICD-10-CM | POA: Diagnosis not present

## 2018-04-07 DIAGNOSIS — Z3480 Encounter for supervision of other normal pregnancy, unspecified trimester: Secondary | ICD-10-CM

## 2018-04-07 NOTE — MAU Note (Signed)
Urine in lab 

## 2018-04-07 NOTE — MAU Note (Signed)
Contractions since last pm, denies bleeding or ROM

## 2018-04-07 NOTE — MAU Note (Signed)
I have communicated with Dr. Hipkins/Dr. Degele and reviewed vital signs:  Vitals:   04/07/18 1052  BP: 114/69  Pulse: 88  Resp: 17  Temp: 98.8 F (37.1 C)  SpO2: 100%    Vaginal exam:  Dilation: 3.5 Effacement (%): 40 Station: -2 Presentation: Vertex Exam by:: k.Jordynn Perrier, RN,   Also reviewed contraction pattern and that non-stress test is reactive.  It has been documented that patient is contracting every 5-7 minutes with no cervical change over 1.5 hours not indicating active labor.  Patient denies any other complaints.  Based on this report provider has given order for discharge.  A discharge order and diagnosis entered by a provider.   Labor discharge instructions reviewed with patient.

## 2018-04-07 NOTE — Discharge Instructions (Signed)
Braxton Hicks Contractions °Contractions of the uterus can occur throughout pregnancy, but they are not always a sign that you are in labor. You may have practice contractions called Braxton Hicks contractions. These false labor contractions are sometimes confused with true labor. °What are Braxton Hicks contractions? °Braxton Hicks contractions are tightening movements that occur in the muscles of the uterus before labor. Unlike true labor contractions, these contractions do not result in opening (dilation) and thinning of the cervix. Toward the end of pregnancy (32-34 weeks), Braxton Hicks contractions can happen more often and may become stronger. These contractions are sometimes difficult to tell apart from true labor because they can be very uncomfortable. You should not feel embarrassed if you go to the hospital with false labor. °Sometimes, the only way to tell if you are in true labor is for your health care provider to look for changes in the cervix. The health care provider will do a physical exam and may monitor your contractions. If you are not in true labor, the exam should show that your cervix is not dilating and your water has not broken. °If there are other health problems associated with your pregnancy, it is completely safe for you to be sent home with false labor. You may continue to have Braxton Hicks contractions until you go into true labor. °How to tell the difference between true labor and false labor °True labor °· Contractions last 30-70 seconds. °· Contractions become very regular. °· Discomfort is usually felt in the top of the uterus, and it spreads to the lower abdomen and low back. °· Contractions do not go away with walking. °· Contractions usually become more intense and increase in frequency. °· The cervix dilates and gets thinner. °False labor °· Contractions are usually shorter and not as strong as true labor contractions. °· Contractions are usually irregular. °· Contractions  are often felt in the front of the lower abdomen and in the groin. °· Contractions may go away when you walk around or change positions while lying down. °· Contractions get weaker and are shorter-lasting as time goes on. °· The cervix usually does not dilate or become thin. °Follow these instructions at home: °· Take over-the-counter and prescription medicines only as told by your health care provider. °· Keep up with your usual exercises and follow other instructions from your health care provider. °· Eat and drink lightly if you think you are going into labor. °· If Braxton Hicks contractions are making you uncomfortable: °? Change your position from lying down or resting to walking, or change from walking to resting. °? Sit and rest in a tub of warm water. °? Drink enough fluid to keep your urine pale yellow. Dehydration may cause these contractions. °? Do slow and deep breathing several times an hour. °· Keep all follow-up prenatal visits as told by your health care provider. This is important. °Contact a health care provider if: °· You have a fever. °· You have continuous pain in your abdomen. °Get help right away if: °· Your contractions become stronger, more regular, and closer together. °· You have fluid leaking or gushing from your vagina. °· You pass blood-tinged mucus (bloody show). °· You have bleeding from your vagina. °· You have low back pain that you never had before. °· You feel your baby’s head pushing down and causing pelvic pressure. °· Your baby is not moving inside you as much as it used to. °Summary °· Contractions that occur before labor are called Braxton   Hicks contractions, false labor, or practice contractions. °· Braxton Hicks contractions are usually shorter, weaker, farther apart, and less regular than true labor contractions. True labor contractions usually become progressively stronger and regular and they become more frequent. °· Manage discomfort from Braxton Hicks contractions by  changing position, resting in a warm bath, drinking plenty of water, or practicing deep breathing. °This information is not intended to replace advice given to you by your health care provider. Make sure you discuss any questions you have with your health care provider. °Document Released: 03/04/2017 Document Revised: 03/04/2017 Document Reviewed: 03/04/2017 °Elsevier Interactive Patient Education © 2018 Elsevier Inc. ° °

## 2018-04-08 ENCOUNTER — Encounter (HOSPITAL_COMMUNITY): Payer: Self-pay

## 2018-04-08 ENCOUNTER — Inpatient Hospital Stay (HOSPITAL_COMMUNITY)
Admission: AD | Admit: 2018-04-08 | Discharge: 2018-04-08 | Disposition: A | Discharge status: Home / Self Care | Payer: Medicaid Other | Source: Ambulatory Visit | Attending: Obstetrics & Gynecology | Admitting: Obstetrics & Gynecology

## 2018-04-08 DIAGNOSIS — O26893 Other specified pregnancy related conditions, third trimester: Secondary | ICD-10-CM | POA: Insufficient documentation

## 2018-04-08 DIAGNOSIS — Z3A38 38 weeks gestation of pregnancy: Secondary | ICD-10-CM | POA: Insufficient documentation

## 2018-04-08 DIAGNOSIS — O479 False labor, unspecified: Secondary | ICD-10-CM

## 2018-04-08 MED ORDER — ACETAMINOPHEN 500 MG PO TABS
1000.0000 mg | ORAL_TABLET | Freq: Once | ORAL | Status: AC
  Administered 2018-04-08: 1000 mg via ORAL
  Filled 2018-04-08: qty 2

## 2018-04-08 NOTE — MAU Note (Signed)
I have communicated with Cleone Slimaroline Neill, CNM and reviewed vital signs:  Vitals:   04/08/18 0151 04/08/18 0319  BP: 118/74 113/77  Pulse: 97   Resp: 17   Temp: 98.4 F (36.9 C)   SpO2: 99%     Vaginal exam:  Dilation: 3.5 Effacement (%): 50 Cervical Position: Middle Station: -2 Presentation: Vertex Exam by:: TLYTLE RN ,   Also reviewed contraction pattern and that non-stress test is reactive.  It has been documented that patient is contracting every 3-5  minutes with no cervical change since yesterday  not indicating active labor.  Patient denies any other complaints.  Based on this report provider has given order for discharge.  A discharge order and diagnosis entered by a provider.   Labor discharge instructions reviewed with patient.

## 2018-04-08 NOTE — MAU Note (Signed)
Was here yesterday, since then CTX got stronger but unsure of how close together.  No LOF/VB.  + FM.

## 2018-04-08 NOTE — Discharge Instructions (Signed)
Braxton Hicks Contractions °Contractions of the uterus can occur throughout pregnancy, but they are not always a sign that you are in labor. You may have practice contractions called Braxton Hicks contractions. These false labor contractions are sometimes confused with true labor. °What are Braxton Hicks contractions? °Braxton Hicks contractions are tightening movements that occur in the muscles of the uterus before labor. Unlike true labor contractions, these contractions do not result in opening (dilation) and thinning of the cervix. Toward the end of pregnancy (32-34 weeks), Braxton Hicks contractions can happen more often and may become stronger. These contractions are sometimes difficult to tell apart from true labor because they can be very uncomfortable. You should not feel embarrassed if you go to the hospital with false labor. °Sometimes, the only way to tell if you are in true labor is for your health care provider to look for changes in the cervix. The health care provider will do a physical exam and may monitor your contractions. If you are not in true labor, the exam should show that your cervix is not dilating and your water has not broken. °If there are other health problems associated with your pregnancy, it is completely safe for you to be sent home with false labor. You may continue to have Braxton Hicks contractions until you go into true labor. °How to tell the difference between true labor and false labor °True labor °· Contractions last 30-70 seconds. °· Contractions become very regular. °· Discomfort is usually felt in the top of the uterus, and it spreads to the lower abdomen and low back. °· Contractions do not go away with walking. °· Contractions usually become more intense and increase in frequency. °· The cervix dilates and gets thinner. °False labor °· Contractions are usually shorter and not as strong as true labor contractions. °· Contractions are usually irregular. °· Contractions  are often felt in the front of the lower abdomen and in the groin. °· Contractions may go away when you walk around or change positions while lying down. °· Contractions get weaker and are shorter-lasting as time goes on. °· The cervix usually does not dilate or become thin. °Follow these instructions at home: °· Take over-the-counter and prescription medicines only as told by your health care provider. °· Keep up with your usual exercises and follow other instructions from your health care provider. °· Eat and drink lightly if you think you are going into labor. °· If Braxton Hicks contractions are making you uncomfortable: °? Change your position from lying down or resting to walking, or change from walking to resting. °? Sit and rest in a tub of warm water. °? Drink enough fluid to keep your urine pale yellow. Dehydration may cause these contractions. °? Do slow and deep breathing several times an hour. °· Keep all follow-up prenatal visits as told by your health care provider. This is important. °Contact a health care provider if: °· You have a fever. °· You have continuous pain in your abdomen. °Get help right away if: °· Your contractions become stronger, more regular, and closer together. °· You have fluid leaking or gushing from your vagina. °· You pass blood-tinged mucus (bloody show). °· You have bleeding from your vagina. °· You have low back pain that you never had before. °· You feel your baby’s head pushing down and causing pelvic pressure. °· Your baby is not moving inside you as much as it used to. °Summary °· Contractions that occur before labor are called Braxton   Hicks contractions, false labor, or practice contractions. °· Braxton Hicks contractions are usually shorter, weaker, farther apart, and less regular than true labor contractions. True labor contractions usually become progressively stronger and regular and they become more frequent. °· Manage discomfort from Braxton Hicks contractions by  changing position, resting in a warm bath, drinking plenty of water, or practicing deep breathing. °This information is not intended to replace advice given to you by your health care provider. Make sure you discuss any questions you have with your health care provider. °Document Released: 03/04/2017 Document Revised: 03/04/2017 Document Reviewed: 03/04/2017 °Elsevier Interactive Patient Education © 2018 Elsevier Inc. ° °

## 2018-04-09 ENCOUNTER — Inpatient Hospital Stay (HOSPITAL_COMMUNITY)
Admission: AD | Admit: 2018-04-09 | Discharge: 2018-04-10 | Disposition: A | Discharge status: Home / Self Care | Payer: Medicaid Other | Source: Ambulatory Visit | Attending: Obstetrics & Gynecology | Admitting: Obstetrics & Gynecology

## 2018-04-09 ENCOUNTER — Encounter (HOSPITAL_COMMUNITY): Payer: Self-pay | Admitting: *Deleted

## 2018-04-09 DIAGNOSIS — O479 False labor, unspecified: Secondary | ICD-10-CM

## 2018-04-09 DIAGNOSIS — Z3480 Encounter for supervision of other normal pregnancy, unspecified trimester: Secondary | ICD-10-CM

## 2018-04-09 NOTE — MAU Note (Signed)
Ctxs since 1600. Denies LOF or bleeding. Seen MAU yesterday and 3.5cm

## 2018-04-10 ENCOUNTER — Encounter (HOSPITAL_COMMUNITY): Payer: Self-pay

## 2018-04-10 ENCOUNTER — Inpatient Hospital Stay (HOSPITAL_COMMUNITY)
Admission: AD | Admit: 2018-04-10 | Discharge: 2018-04-12 | DRG: 807 | Disposition: A | Discharge status: Home / Self Care | Payer: Medicaid Other | Attending: Obstetrics & Gynecology | Admitting: Obstetrics & Gynecology

## 2018-04-10 DIAGNOSIS — O368390 Maternal care for abnormalities of the fetal heart rate or rhythm, unspecified trimester, not applicable or unspecified: Secondary | ICD-10-CM | POA: Diagnosis present

## 2018-04-10 DIAGNOSIS — Z3A38 38 weeks gestation of pregnancy: Secondary | ICD-10-CM

## 2018-04-10 DIAGNOSIS — Z3483 Encounter for supervision of other normal pregnancy, third trimester: Secondary | ICD-10-CM | POA: Diagnosis present

## 2018-04-10 DIAGNOSIS — Z3480 Encounter for supervision of other normal pregnancy, unspecified trimester: Secondary | ICD-10-CM

## 2018-04-10 DIAGNOSIS — O4693 Antepartum hemorrhage, unspecified, third trimester: Secondary | ICD-10-CM

## 2018-04-10 LAB — CBC
HEMATOCRIT: 34 % — AB (ref 36.0–46.0)
HEMATOCRIT: 32.5 % — AB (ref 36.0–46.0)
HEMOGLOBIN: 11.2 g/dL — AB (ref 12.0–15.0)
Hemoglobin: 10.6 g/dL — ABNORMAL LOW (ref 12.0–15.0)
MCH: 28.9 pg (ref 26.0–34.0)
MCH: 28.8 pg (ref 26.0–34.0)
MCHC: 32.9 g/dL (ref 30.0–36.0)
MCHC: 32.6 g/dL (ref 30.0–36.0)
MCV: 87.9 fL (ref 78.0–100.0)
MCV: 88.3 fL (ref 78.0–100.0)
Platelets: 185 10*3/uL (ref 150–400)
Platelets: 161 10*3/uL (ref 150–400)
RBC: 3.87 MIL/uL (ref 3.87–5.11)
RBC: 3.68 MIL/uL — AB (ref 3.87–5.11)
RDW: 12.9 % (ref 11.5–15.5)
RDW: 12.8 % (ref 11.5–15.5)
WBC: 6.9 10*3/uL (ref 4.0–10.5)
WBC: 11.8 10*3/uL — AB (ref 4.0–10.5)

## 2018-04-10 LAB — TYPE AND SCREEN
ABO/RH(D): A POS
Antibody Screen: NEGATIVE

## 2018-04-10 MED ORDER — METHYLERGONOVINE MALEATE 0.2 MG/ML IJ SOLN
0.2000 mg | Freq: Once | INTRAMUSCULAR | Status: AC
  Administered 2018-04-10: 0.2 mg via INTRAMUSCULAR

## 2018-04-10 MED ORDER — METHYLERGONOVINE MALEATE 0.2 MG/ML IJ SOLN: 0.2000 mg | INTRAMUSCULAR | Status: AC

## 2018-04-10 MED ORDER — ACETAMINOPHEN 325 MG PO TABS: 650.0000 mg | ORAL_TABLET | ORAL | Status: DC | PRN

## 2018-04-10 MED ORDER — WITCH HAZEL-GLYCERIN EX PADS: 1.0000 "application " | MEDICATED_PAD | CUTANEOUS | Status: DC | PRN

## 2018-04-10 MED ORDER — ONDANSETRON HCL 4 MG/2ML IJ SOLN: 4.0000 mg | Freq: Four times a day (QID) | INTRAMUSCULAR | Status: DC | PRN

## 2018-04-10 MED ORDER — OXYTOCIN BOLUS FROM INFUSION
500.0000 mL | Freq: Once | INTRAVENOUS | Status: AC
  Administered 2018-04-10: 500 mL via INTRAVENOUS

## 2018-04-10 MED ORDER — ZOLPIDEM TARTRATE 5 MG PO TABS: 5.0000 mg | ORAL_TABLET | Freq: Every evening | ORAL | Status: DC | PRN

## 2018-04-10 MED ORDER — OXYCODONE-ACETAMINOPHEN 5-325 MG PO TABS
1.0000 | ORAL_TABLET | ORAL | Status: DC | PRN
  Administered 2018-04-10: 1 via ORAL
  Filled 2018-04-10: qty 1

## 2018-04-10 MED ORDER — OXYTOCIN 40 UNITS IN LACTATED RINGERS INFUSION - SIMPLE MED
2.5000 [IU]/h | INTRAVENOUS | Status: DC
  Administered 2018-04-10: 2.5 [IU]/h via INTRAVENOUS
  Filled 2018-04-10: qty 1000

## 2018-04-10 MED ORDER — COCONUT OIL OIL
1.0000 "application " | TOPICAL_OIL | Status: DC | PRN
  Administered 2018-04-11: 1 via TOPICAL
  Filled 2018-04-10: qty 120

## 2018-04-10 MED ORDER — LIDOCAINE HCL (PF) 1 % IJ SOLN
30.0000 mL | INTRAMUSCULAR | Status: DC | PRN
  Filled 2018-04-10: qty 30

## 2018-04-10 MED ORDER — DIPHENHYDRAMINE HCL 25 MG PO CAPS: 25.0000 mg | ORAL_CAPSULE | Freq: Four times a day (QID) | ORAL | Status: DC | PRN

## 2018-04-10 MED ORDER — SOD CITRATE-CITRIC ACID 500-334 MG/5ML PO SOLN: 30.0000 mL | ORAL | Status: DC | PRN

## 2018-04-10 MED ORDER — IBUPROFEN 600 MG PO TABS
600.0000 mg | ORAL_TABLET | Freq: Four times a day (QID) | ORAL | Status: DC | PRN
  Administered 2018-04-10: 600 mg via ORAL
  Filled 2018-04-10: qty 1

## 2018-04-10 MED ORDER — METHYLERGONOVINE MALEATE 0.2 MG PO TABS
0.2000 mg | ORAL_TABLET | ORAL | Status: AC
  Administered 2018-04-10 – 2018-04-11 (×6): 0.2 mg via ORAL
  Filled 2018-04-10 (×7): qty 1

## 2018-04-10 MED ORDER — FENTANYL CITRATE (PF) 100 MCG/2ML IJ SOLN: 100.0000 ug | INTRAMUSCULAR | Status: DC | PRN

## 2018-04-10 MED ORDER — LACTATED RINGERS IV SOLN: INTRAVENOUS | Status: DC

## 2018-04-10 MED ORDER — ZOLPIDEM TARTRATE 5 MG PO TABS
5.0000 mg | ORAL_TABLET | Freq: Once | ORAL | Status: AC
  Administered 2018-04-10: 5 mg via ORAL
  Filled 2018-04-10: qty 1

## 2018-04-10 MED ORDER — IBUPROFEN 600 MG PO TABS
600.0000 mg | ORAL_TABLET | Freq: Four times a day (QID) | ORAL | Status: DC
  Administered 2018-04-10 – 2018-04-12 (×7): 600 mg via ORAL
  Filled 2018-04-10 (×7): qty 1

## 2018-04-10 MED ORDER — MAGNESIUM HYDROXIDE 400 MG/5ML PO SUSP: 30.0000 mL | ORAL | Status: DC | PRN

## 2018-04-10 MED ORDER — METHYLERGONOVINE MALEATE 0.2 MG/ML IJ SOLN
INTRAMUSCULAR | Status: AC
  Filled 2018-04-10: qty 1

## 2018-04-10 MED ORDER — ONDANSETRON HCL 4 MG/2ML IJ SOLN: 4.0000 mg | INTRAMUSCULAR | Status: DC | PRN

## 2018-04-10 MED ORDER — LACTATED RINGERS IV SOLN: 500.0000 mL | INTRAVENOUS | Status: DC | PRN

## 2018-04-10 MED ORDER — BENZOCAINE-MENTHOL 20-0.5 % EX AERO: 1.0000 "application " | INHALATION_SPRAY | CUTANEOUS | Status: DC | PRN

## 2018-04-10 MED ORDER — SIMETHICONE 80 MG PO CHEW: 80.0000 mg | CHEWABLE_TABLET | ORAL | Status: DC | PRN

## 2018-04-10 MED ORDER — ONDANSETRON HCL 4 MG PO TABS: 4.0000 mg | ORAL_TABLET | ORAL | Status: DC | PRN

## 2018-04-10 MED ORDER — TETANUS-DIPHTH-ACELL PERTUSSIS 5-2.5-18.5 LF-MCG/0.5 IM SUSP: 0.5000 mL | Freq: Once | INTRAMUSCULAR | Status: DC

## 2018-04-10 MED ORDER — MEASLES, MUMPS & RUBELLA VAC ~~LOC~~ INJ: 0.5000 mL | INJECTION | Freq: Once | SUBCUTANEOUS | Status: DC

## 2018-04-10 MED ORDER — FERROUS SULFATE 325 (65 FE) MG PO TABS
325.0000 mg | ORAL_TABLET | Freq: Two times a day (BID) | ORAL | Status: DC
  Administered 2018-04-10 – 2018-04-12 (×4): 325 mg via ORAL
  Filled 2018-04-10 (×4): qty 1

## 2018-04-10 MED ORDER — DIBUCAINE 1 % RE OINT: 1.0000 "application " | TOPICAL_OINTMENT | RECTAL | Status: DC | PRN

## 2018-04-10 MED ORDER — OXYCODONE-ACETAMINOPHEN 5-325 MG PO TABS: 2.0000 | ORAL_TABLET | ORAL | Status: DC | PRN

## 2018-04-10 MED ORDER — FLEET ENEMA 7-19 GM/118ML RE ENEM: 1.0000 | ENEMA | RECTAL | Status: DC | PRN

## 2018-04-10 MED ORDER — PRENATAL MULTIVITAMIN CH
1.0000 | ORAL_TABLET | Freq: Every day | ORAL | Status: DC
  Administered 2018-04-11 – 2018-04-12 (×2): 1 via ORAL
  Filled 2018-04-10 (×2): qty 1

## 2018-04-10 NOTE — MAU Provider Note (Signed)
S:  Carla Haley is a 27 y.o. female 103P2002 @ 6475w3d here in MAU from EMS with vaginal bleeding. Says she was seen last night around midnight and was 5 cm, she was not in active labor and was sent home. Says her husband called 911 this morning when he saw blood that soaked through her clothes.    O:  GENERAL: Well-developed, well-nourished female in no acute distress.  LUNGS: Effort normal SKIN: Warm, dry and without erythema PSYCH: Normal mood and affect SPECULUM:Vagina - Small-Moderate amount of bright red, mucoid discharge.  Bimanual exam: Dilation: 5.5 Effacement (%): 80 Station: -2 Presentation: Vertex Exam by:: j rasch np Chaperone present for exam.   Fetal Tracing: Baseline: 125 bpm Variability: moderate  Accelerations: 15x15 Decelerations: none Toco: Q4-5   A:  1. Indication for care in labor and delivery, antepartum   2. Vaginal bleeding in pregnancy, third trimester     P:  Admit to labor and delivery   Rasch, Carla RutherfordJennifer I, NP 04/10/2018 10:55 AM

## 2018-04-10 NOTE — MAU Note (Signed)
I have communicated with Dr. Nira Retortegele and reviewed vital signs:  Vitals:   04/09/18 2207 04/10/18 0050  BP: 106/70 116/67  Pulse: 94 (!) 103  Resp: 18 18  Temp: 98.2 F (36.8 C)     Vaginal exam:  Dilation: 5 Effacement (%): 70 Cervical Position: Middle Station: -2 Presentation: Vertex Exam by:: K.Damareon Lanni,RN,   Also reviewed contraction pattern and that non-stress test is reactive.  It has been documented that patient is contracting every 5 minutes with minimal cervical change over 2 hours not indicating active labor.  Patient denies any other complaints.  Based on this report provider has given order for discharge.  A discharge order and diagnosis entered by a provider.   Labor discharge instructions reviewed with patient.

## 2018-04-10 NOTE — H&P (Signed)
HPI: Carla Haley is a 27 y.o. year old 613P2002 female at 3664w3d weeks gestation who presents to MAU by EMS reporting vaginal bleeding and contractions. Denies LOF. NP assessed pt upon arrival and performed spec exam. Bleeding C/W heavy bloody show. Unlikely abruption. VE 5.5/80/-2  -->admitted for labor.  [facility]  Dating 05/02/18 Blood type:   a pos  Genetic Screen  ZOX:[id number]:negative    NIPS: low risk Antibody: neg  Anatomic US  Arrhythmia noted - otherwise normal, FU - no  Arrhythmia noted Rubella:  imm  GTT Early:               Third trimester: 67-100-96 RPR:   neg  Flu vaccine Declined  HBsAg:   neg  TDaP vaccine     02/08/18                                        Rhogam: N/A HIV: neg  Baby Food  Breast                 GBS:  Negative  Contraception  Nexplanon Pap: 11/03/2017  Circumcision  No, wants to wait one year   Pediatrician  [facility] CF: neg  Support Person  [name](fob) SMA: reduced risk  Prenatal Classes  n/a 36 wk Hgb electrophoresis: neg            OB History    Gravida  3   Para  2   Term  2   Preterm  0   AB  0   Living  2     SAB  0   TAB  0   Ectopic  0   Multiple  0   Live Births  2              Past Medical History:  Diagnosis Date  . Medical history non-contributory    Past Surgical History:  Procedure Laterality Date  . NO PAST SURGERIES     Family History: family history includes Hypertension in her mother. Social History:  reports that she has never smoked. She has never used smokeless tobacco. She reports that she does not drink alcohol or use drugs.     Maternal Diabetes: No Genetic Screening: Normal Maternal Ultrasounds/Referrals: Normal Fetal Ultrasounds or other Referrals:  None Maternal Substance Abuse:  No Significant Maternal Medications:  None Significant Maternal Lab Results:  None Other Comments:  fetal PAC's on US. Not present on F/U US.   ROS History Dilation:  5.5 Effacement (%): 80 Station: -2 Exam by:: j rasch np Blood pressure 100/74, pulse 97, temperature 98 F (36.7 C), temperature source Oral, resp. rate 18, last menstrual period 07/26/2017, unknown if currently breastfeeding. Exam Physical Exam  Prenatal labs: ABO, Rh: A/Positive/-- (01/02 1132) Antibody: Negative (01/02 1132) Rubella: 12.40 (01/02 1132) RPR: Non Reactive (04/03 0858)  HBsAg: Negative (01/02 1132)  HIV: Non Reactive (04/03 0858)  GBS:     Assessment: 1. Labor: Active 2. Fetal Wellbeing: Category I  3. Pain Control: None 4. GBS: Neg 5. 38.3 week IUP 6. Vaginal Bleeding likely heavy bloody show  Plan:  1. Admit to BS per consult with MD 2. Routine L&D orders 3. Analgesia/anesthesia PRN  4. CTO bleeding closely. T&S blood. Consider crossmatch PRN 5. UDS  Dorathy KinsmanVirginia Smith 04/10/2018, 9:22 AM

## 2018-04-10 NOTE — MAU Note (Signed)
Has been here for false labor, woke up this AM with bleeding and some clots, husband called EMS. Pt reports +FM, no LOF

## 2018-04-10 NOTE — Anesthesia Pain Management Evaluation Note (Signed)
  CRNA Pain Management Visit Note  Patient: Carla Haley, 27 y.o., female  "Hello I am a member of the anesthesia team at [facility]. We have an anesthesia team available at all times to provide care throughout the hospital, including epidural management and anesthesia for C-section. I don't know your plan for the delivery whether it a natural birth, water birth, IV sedation, nitrous supplementation, doula or epidural, but we want to meet your pain goals."   1.Was your pain managed to your expectations on prior hospitalizations?   Yes   2.What is your expectation for pain management during this hospitalization?     natural  3.How can we help you reach that goal? Natural   Record the patient's initial score and the patient's pain goal.   Pain: 10  Pain Goal: 10 [facility] wants you to be able to say your pain was always managed very well.  Rica RecordsICKELTON,Carla Haley 04/10/2018

## 2018-04-10 NOTE — Lactation Note (Signed)
This note was copied from a baby's chart. Lactation Consultation Note  Patient Name: Carla Haley Today's Date: 04/10/2018 Reason for consult: Initial assessment;Early term 8137-38.6wks  4 hours old early term female who is being exclusively BF by his mother, she's a P3. Mom is experienced BF she was able to BF her first child for 12 months and her second one for 8 months. She voiced that she plans to supplement with formula at some point once she goes back to school, advised mom to wait at least 3-4 weeks to supplement once BF is well established unless it's medically necessary. [facility] [place] and she doesn't have a pump at home. LC offered one but mom declined stating that she's just going to BF her baby when she's home and bottle feed when she's not home.  Per mom feedings at the breast are comfortable and both of her nipples looked intact upon examination with no signs of trauma. Offered assistance with latch, but mom stated that she already fed baby not too long ago and that she doesn't want to disturb him, baby was asleep and swaddled in his bassinet. Asked mom to call for latch assistance when needed.  Encouraged mom to feed baby STS 8-12 times/24 hours or sooner if feeding cues are present. If baby is not cueing in a 3 hour period, mom will place him STS on to the breast to give him an opportunity to feed. BF brochure, BF resources and feeding diary were shared, mom is aware of LC services and will call PRN.  Maternal Data Formula Feeding for Exclusion: No Has patient been taught Hand Expression?: Yes Does the patient have breastfeeding experience prior to this delivery?: Yes  Feeding Feeding Type: Breast Fed Length of feed: 5 min  LATCH Score    Audible Swallowing: A few with stimulation  Type of Nipple: Everted at rest and after stimulation  Comfort (Breast/Nipple): Soft / non-tender  Hold (Positioning): Assistance needed to correctly  position infant at breast and maintain latch.     Interventions Interventions: Breast feeding basics reviewed  Lactation Tools Discussed/Used WIC Program: Yes   Consult Status Consult Status: Follow-up Date: 04/11/18 Follow-up type: In-patient    Carla Haley 04/10/2018, 6:15 PM

## 2018-04-11 ENCOUNTER — Encounter (INDEPENDENT_AMBULATORY_CARE_PROVIDER_SITE_OTHER): Payer: Self-pay

## 2018-04-11 LAB — RPR: RPR Ser Ql: NONREACTIVE

## 2018-04-11 NOTE — Progress Notes (Signed)
Post Partum Day 1 Subjective: Feeling well, no complaints, up ad lib, voiding and tolerating PO.  Objective: Blood pressure 115/77, pulse 70, temperature 97.8 F (36.6 C), temperature source Oral, resp. rate 17, height 5\' 3"  (1.6 m), weight 133 lb 3.2 oz (60.4 kg), last menstrual period 07/26/2017, SpO2 100 %, currently breastfeeding.  Physical Exam:  General: alert, cooperative, fatigued and no distress Lochia: appropriate Uterine Fundus: firm, U+1 deviated to maternal RT DVT Evaluation: No evidence of DVT seen on physical exam. Negative Homan's sign. No cords or calf tenderness. No significant calf/ankle edema.  Recent Labs    04/10/18 1014 04/10/18 1409  HGB 11.2* 10.6*  HCT 34.0* 32.5*    Assessment/Plan: Plan for discharge tomorrow, Breastfeeding and Contraception Nexplanon (PP)   LOS: 1 day   Carla Moraolitta Juliani Laduke, MSN, CNM 04/11/2018, 7:34 AM

## 2018-04-11 NOTE — Progress Notes (Signed)
MOB was referred for history of depression/anxiety. * Referral screened out by Clinical Social Worker because none of the following criteria appear to apply: ~ History of anxiety/depression during this pregnancy, or of post-partum depression. ~ Diagnosis of anxiety and/or depression within last 3 years OR * MOB's symptoms currently being treated with medication and/or therapy. Please contact the Clinical Social Worker if needs arise, by MOB request, or if MOB scores greater than 9/yes to question 10 on Edinburgh Postpartum Depression Screen.  MOB was assessed for hx of PPD in 2017 after noting symptoms of PPD after delivery in 2015.  Documentation states that she felt symptoms were due to recently relocating to [place] from [place] Refugees and being away from family and friends.  MOB reported in 2017 that her situation was much different and that she was surrounded by new friends from [place] and much more integrated into her community by this time. 

## 2018-04-12 ENCOUNTER — Encounter (HOSPITAL_COMMUNITY): Payer: Self-pay | Admitting: *Deleted

## 2018-04-12 MED ORDER — IBUPROFEN 600 MG PO TABS: 600.0000 mg | ORAL_TABLET | Freq: Four times a day (QID) | ORAL | 0 refills | Status: DC

## 2018-04-12 NOTE — Discharge Instructions (Signed)
Postpartum Care After Vaginal Delivery °The period of time right after you deliver your newborn is called the postpartum period. °What kind of medical care will I receive? °· You may continue to receive fluids and medicines through an IV tube inserted into one of your veins. °· If an incision was made near your vagina (episiotomy) or if you had some vaginal tearing during delivery, cold compresses may be placed on your episiotomy or your tear. This helps to reduce pain and swelling. °· You may be given a squirt bottle to use when you go to the bathroom. You may use this until you are comfortable wiping as usual. To use the squirt bottle, follow these steps: °? Before you urinate, fill the squirt bottle with warm water. Do not use hot water. °? After you urinate, while you are sitting on the toilet, use the squirt bottle to rinse the area around your urethra and vaginal opening. This rinses away any urine and blood. °? You may do this instead of wiping. As you start healing, you may use the squirt bottle before wiping yourself. Make sure to wipe gently. °? Fill the squirt bottle with clean water every time you use the bathroom. °· You will be given sanitary pads to wear. °How can I expect to feel? °· You may not feel the need to urinate for several hours after delivery. °· You will have some soreness and pain in your abdomen and vagina. °· If you are breastfeeding, you may have uterine contractions every time you breastfeed for up to several weeks postpartum. Uterine contractions help your uterus return to its normal size. °· It is normal to have vaginal bleeding (lochia) after delivery. The amount and appearance of lochia is often similar to a menstrual period in the first week after delivery. It will gradually decrease over the next few weeks to a dry, yellow-brown discharge. For most women, lochia stops completely by 6-8 weeks after delivery. Vaginal bleeding can vary from woman to woman. °· Within the first few  days after delivery, you may have breast engorgement. This is when your breasts feel heavy, full, and uncomfortable. Your breasts may also throb and feel hard, tightly stretched, warm, and tender. After this occurs, you may have milk leaking from your breasts. Your health care provider can help you relieve discomfort due to breast engorgement. Breast engorgement should go away within a few days. °· You may feel more sad or worried than normal due to hormonal changes after delivery. These feelings should not last more than a few days. If these feelings do not go away after several days, speak with your health care provider. °How should I care for myself? °· Tell your health care provider if you have pain or discomfort. °· Drink enough water to keep your urine clear or pale yellow. °· Wash your hands thoroughly with soap and water for at least 20 seconds after changing your sanitary pads, after using the toilet, and before holding or feeding your baby. °· If you are not breastfeeding, avoid touching your breasts a lot. Doing this can make your breasts produce more milk. °· If you become weak or lightheaded, or you feel like you might faint, ask for help before: °? Getting out of bed. °? Showering. °· Change your sanitary pads frequently. Watch for any changes in your flow, such as a sudden increase in volume, a change in color, the passing of large blood clots. If you pass a blood clot from your vagina, save it   to show to your health care provider. Do not flush blood clots down the toilet without having your health care provider look at them. °· Make sure that all your vaccinations are up to date. This can help protect you and your baby from getting certain diseases. You may need to have immunizations done before you leave the hospital. °· If desired, talk with your health care provider about methods of family planning or birth control (contraception). °How can I start bonding with my baby? °Spending as much time as  possible with your baby is very important. During this time, you and your baby can get to know each other and develop a bond. Having your baby stay with you in your room (rooming in) can give you time to get to know your baby. Rooming in can also help you become comfortable caring for your baby. Breastfeeding can also help you bond with your baby. °How can I plan for returning home with my baby? °· Make sure that you have a car seat installed in your vehicle. °? Your car seat should be checked by a certified car seat installer to make sure that it is installed safely. °? Make sure that your baby fits into the car seat safely. °· Ask your health care provider any questions you have about caring for yourself or your baby. Make sure that you are able to contact your health care provider with any questions after leaving the hospital. °This information is not intended to replace advice given to you by your health care provider. Make sure you discuss any questions you have with your health care provider. °Document Released: 08/16/2007 Document Revised: 03/23/2016 Document Reviewed: 09/23/2015 °Elsevier Interactive Patient Education © 2018 [employer] ° °

## 2018-04-12 NOTE — Discharge Summary (Signed)
OB Discharge Summary     Patient Name: Carla HeidelbergLaurette Gayler DOB: 1991/03/21 MRN: [id number]  Date of admission: 04/10/2018 Delivering MD: Dorathy KinsmanSMITH, [place]   Date of discharge: 04/12/2018  Admitting diagnosis: 38WKS CTX, BLEEDING Intrauterine pregnancy: 3067w5d     Secondary diagnosis:  Principal Problem:   SVD (spontaneous vaginal delivery) Active Problems:   Supervision of other normal pregnancy, antepartum   Fetal arrhythmia affecting pregnancy, antepartum   Labor and delivery, indication for care  Additional problems: h/o postpartum depression     Discharge diagnosis: Term Pregnancy Delivered                                                                                                Post partum procedures:none  Augmentation: AROM  Complications: None  Hospital course:  Onset of Labor With Vaginal Delivery     27 y.o. yo [id number] at 7067w5d was admitted in Active Labor on 04/10/2018. Patient had an uncomplicated labor course as follows:  Membrane Rupture Time/Date: 12:03 PM ,04/10/2018   Intrapartum Procedures: Episiotomy: None [1]                                         Lacerations:  None [1]  Patient had a delivery of a Viable infant. 04/10/2018  Information for the patient's newborn:  Sherrie Georgezine, Boy Vicie [[id number]][[id number]]  Delivery Method: Vag-Spont    Pateint had an uncomplicated postpartum course.  She is ambulating, tolerating a regular diet, passing flatus, and urinating well. Patient is discharged home in stable condition on 04/12/18.   Physical exam  Vitals:   04/10/18 2118 04/11/18 0605 04/11/18 1425 04/12/18 0550  BP: (!) 103/59 115/77 109/78 109/76  Pulse: 93 70 73 65  Resp: 17 17 18 18   Temp: 98.5 F (36.9 C) 97.8 F (36.6 C) 98 F (36.7 C) 97.7 F (36.5 C)  TempSrc: Oral Oral Oral Oral  SpO2: 99% 100%    Weight:      Height:       General: alert, cooperative and no distress Lochia: appropriate Uterine Fundus: firm Incision: N/A DVT Evaluation: No evidence of  DVT seen on physical exam. No significant calf/ankle edema. Labs: Lab Results  Component Value Date   WBC 11.8 (H) 04/10/2018   HGB 10.6 (L) 04/10/2018   HCT 32.5 (L) 04/10/2018   MCV 88.3 04/10/2018   PLT 161 04/10/2018   CMP Latest Ref Rng & Units 09/06/2017  Glucose 65 - 99 mg/dL 75  BUN 6 - 20 mg/dL 6  Creatinine 6.960.44 - 2.951.00 mg/dL 2.840.53  Sodium 132135 - 440145 mmol/L 132(L)  Potassium 3.5 - 5.1 mmol/L 3.9  Chloride 101 - 111 mmol/L 104  CO2 22 - 32 mmol/L 21(L)  Calcium 8.9 - 10.3 mg/dL 1.0(U8.8(L)  Total Protein 6.5 - 8.1 g/dL 6.8  Total Bilirubin 0.3 - 1.2 mg/dL 0.5  Alkaline Phos 38 - 126 U/L 47  AST 15 - 41 U/L 14(L)  ALT 14 - 54 U/L 11(L)    Discharge instruction: per  After Visit Summary and "Baby and Me Booklet".  After visit meds:  Allergies as of 04/12/2018   No Known Allergies     Medication List    TAKE these medications   acetaminophen 325 MG tablet Commonly known as:  TYLENOL Take 2 tablets (650 mg total) by mouth every 4 (four) hours as needed (for pain scale < 4).   ibuprofen 600 MG tablet Commonly known as:  ADVIL,MOTRIN Take 1 tablet (600 mg total) by mouth every 6 (six) hours.   PRENATAL GUMMIES/DHA & FA 0.4-32.5 MG Chew Chew 1 tablet by mouth daily at 6 (six) AM.   ranitidine 150 MG tablet Commonly known as:  ZANTAC Take 1 tablet (150 mg total) by mouth 2 (two) times daily.       Diet: routine diet  Activity: Advance as tolerated. Pelvic rest for 6 weeks.   Outpatient follow up:4 weeks Follow up Appt: Future Appointments  Date Time Provider Department Center  05/23/2018  2:55 PM Armando Reichert, CNM [facility]   Follow up Visit:No follow-ups on file.  Postpartum contraception: Nexplanon  Newborn Data: Live born female  Birth Weight: 7 lb 13.9 oz (3570 g) APGAR: 9, 9  Newborn Delivery   Birth date/time:  04/10/2018 13:30:00 Delivery type:  Vaginal, Spontaneous     Baby Feeding: Breast Disposition:home with  mother   04/12/2018 Frederik Pear, MD

## 2018-04-12 NOTE — Progress Notes (Signed)
Patient ID: Carla Haley, female   DOB: 27-Jul-1991, 27 y.o.   MRN: [id number] Late entry  Subjective: Coping well w/ comfort measures  Objective: VSS  FHT:  FHR: 140 bpm, variability: mod,  accelerations:  15x15,  decelerations:  none UC:   Q 4-5 minutes, mod 5.5/80/-1 AROM small amount of clear fluid  Assessment / Plan: 1656w5d week IUP Labor: Early Fetal Wellbeing:  Category I Pain Control:  Comfort meaures Anticipated MOD:  SVD  Dorathy KinsmanSmith, Khizar Fiorella, CNM 04/12/2018 4:19 PM

## 2018-04-12 NOTE — Lactation Note (Signed)
This note was copied from a baby's chart. Lactation Consultation Note  Patient Name: Carla Haley Today's Date: 04/12/2018   P3, Ex BF.  Baby 45 hours old. 8% Baby latched upon entering and states breastfeeding hurts.   Observed latch and assisted with increased depth.  Mother states biting. Attempted latching w/ #20NS and pain increased. Assisted w/ chin tug to help with initial latch to ease pain. Mother has coconut oil. Oral assessment indicated short anterior lingual frenulum contributing to limited tongue mobility. Suggest discussing with Peds MD. Mother states she did not have this kind of pain with first 2 children she breastfed. Mother has hand pump and can pump if it becomes too sore and suggest she call if pain/soreness becomes worse. Mom encouraged to feed baby 8-12 times/24 hours and with feeding cues.  Reviewed engorgement care and monitoring voids/stools.      Maternal Data    Feeding Feeding Type: Breast Fed  LATCH Score Latch: Grasps breast easily, tongue down, lips flanged, rhythmical sucking.  Audible Swallowing: Spontaneous and intermittent  Type of Nipple: Everted at rest and after stimulation  Comfort (Breast/Nipple): Soft / non-tender  Hold (Positioning): No assistance needed to correctly position infant at breast.  LATCH Score: 10  Interventions    Lactation Tools Discussed/Used     Consult Status      Dahlia ByesBerkelhammer, Zailen Albarran [facility] 04/12/2018, 11:24 AM

## 2018-04-13 ENCOUNTER — Encounter: Payer: Medicaid Other | Admitting: Medical

## 2018-04-19 ENCOUNTER — Other Ambulatory Visit: Payer: Self-pay | Admitting: Family Medicine

## 2018-04-20 ENCOUNTER — Encounter: Payer: Medicaid Other | Admitting: Medical

## 2018-05-23 ENCOUNTER — Ambulatory Visit (INDEPENDENT_AMBULATORY_CARE_PROVIDER_SITE_OTHER): Payer: Medicaid Other | Admitting: Advanced Practice Midwife

## 2018-05-23 ENCOUNTER — Encounter: Payer: Self-pay | Admitting: Advanced Practice Midwife

## 2018-05-23 VITALS — BP 95/60 | HR 79 | Wt 121.9 lb

## 2018-05-23 DIAGNOSIS — Z1389 Encounter for screening for other disorder: Secondary | ICD-10-CM

## 2018-05-23 DIAGNOSIS — Z3202 Encounter for pregnancy test, result negative: Secondary | ICD-10-CM | POA: Diagnosis not present

## 2018-05-23 DIAGNOSIS — Z3046 Encounter for surveillance of implantable subdermal contraceptive: Secondary | ICD-10-CM

## 2018-05-23 DIAGNOSIS — Z3009 Encounter for other general counseling and advice on contraception: Secondary | ICD-10-CM

## 2018-05-23 DIAGNOSIS — Z30017 Encounter for initial prescription of implantable subdermal contraceptive: Secondary | ICD-10-CM

## 2018-05-23 LAB — POCT PREGNANCY, URINE: Preg Test, Ur: NEGATIVE

## 2018-05-23 MED ORDER — ETONOGESTREL 68 MG ~~LOC~~ IMPL
68.0000 mg | DRUG_IMPLANT | Freq: Once | SUBCUTANEOUS | Status: AC
  Administered 2018-05-23: 68 mg via SUBCUTANEOUS

## 2018-05-23 NOTE — Progress Notes (Signed)
Subjective:     Carla Haley is a 27 y.o. female who presents for a postpartum visit. She is 6 weeks postpartum following a spontaneous vaginal delivery. I have fully reviewed the prenatal and intrapartum course. The delivery was at 38 gestational weeks. Outcome: spontaneous vaginal delivery. Anesthesia: none. Postpartum course has been umcomplicated. Baby's course has been uncomplicated. Baby is feeding by breast. Bleeding no bleeding. Bowel function is normal. Bladder function is normal. Patient is sexually active. Contraception method is Nexplanon. Postpartum depression screening: negative.  The following portions of the patient's history were reviewed and updated as appropriate: allergies, current medications, past family history, past medical history, past social history, past surgical history and problem list.  Review of Systems Pertinent items are noted in HPI.   Objective:    BP 95/60   Pulse 79   Wt 121 lb 14.4 oz (55.3 kg)   Breastfeeding? Yes   BMI 21.59 kg/m   General:  alert, cooperative and no distress   Breasts:  negative  Lungs: clear to auscultation bilaterally  Heart:  regular rate and rhythm, S1, S2 normal, no murmur, click, rub or gallop  Abdomen: soft, non-tender; bowel sounds normal; no masses,  no organomegaly   Vulva:  not evaluated  Vagina: not evaluated  Cervix:  not examined   Corpus: not examined  Adnexa:  not evaluated  Rectal Exam: Not performed.         GYNECOLOGY OFFICE PROCEDURE NOTE  Carla Haley is a 27 y.o. [id number] here for  Nexplanon insertion.  Last pap smear was on 11/03/2017 and was normal.  No other gynecologic concerns.  Nexplanon Insertion Procedure Patient identified, informed consent performed, consent signed.   Patient does understand that irregular bleeding is a very common side effect of this medication. She was advised to have backup contraception for one week after placement. Pregnancy test in clinic today was negative.   Appropriate time out taken.  Patient's left arm was prepped and draped in the usual sterile fashion. The ruler used to measure and mark insertion area.  Patient was prepped with alcohol swab and then injected with 3 ml of 1% lidocaine.  She was prepped with betadine, Nexplanon removed from packaging,  Device confirmed in needle, then inserted full length of needle and withdrawn per handbook instructions. Nexplanon was able to palpated in the patient's arm; patient palpated the insert herself. There was minimal blood loss.  Patient insertion site covered with guaze and a pressure bandage to reduce any bruising.  The patient tolerated the procedure well and was given post procedure instructions.    Thressa ShellerHeather Hogan 3:34 PM 05/23/18   Assessment:     routine postpartum exam. Pap smear not done at today's visit.   Plan:    1. Contraception: Nexplanon 2. Routine care 3. Follow up in: 1 year or as needed.

## 2018-05-23 NOTE — Addendum Note (Signed)
Addended by: Angelina SheriffBARHAM, Amylia Collazos R on: 05/23/2018 03:51 PM   Modules accepted: Orders

## 2020-11-02 NOTE — L&D Delivery Note (Signed)
Delivery Note Progressed to complete dilation and pushed only twice to delivery  At 7:39 AM a viable and healthy female was delivered via  (Presentation: LOA with compound hand).  APGAR: 9, 9; weight  .   Placenta status: Spontaneous, Intact.  Cord: 3 vessels with the following complications: None.    Anesthesia: Epidural Episiotomy: None Lacerations: None Suture Repair:  none Est. Blood Loss ( ):    Mom to postpartum.  Baby to Couplet care / Skin to Skin.  Wynelle Bourgeois 10/17/2021, 7:54 AM   Please schedule this patient for Postpartum visit in: 4 weeks with the following provider: Any provider In-Person For C/S patients schedule nurse incision check in weeks 2 weeks: no Low risk pregnancy complicated by:  marginal cord insertion Delivery mode:  SVD Anticipated Birth Control:  Nexplanon PP Procedures needed:  none   Edinburgh: negative Schedule Integrated BH visit: no  No relevant baby issues

## 2021-03-10 ENCOUNTER — Inpatient Hospital Stay (HOSPITAL_COMMUNITY)
Admission: AD | Admit: 2021-03-10 | Discharge: 2021-03-10 | Disposition: A | Discharge status: Home / Self Care | Payer: 59 | Attending: Family Medicine | Admitting: Family Medicine

## 2021-03-10 ENCOUNTER — Other Ambulatory Visit: Payer: Self-pay

## 2021-03-10 ENCOUNTER — Encounter (HOSPITAL_COMMUNITY): Payer: Self-pay | Admitting: Family Medicine

## 2021-03-10 ENCOUNTER — Inpatient Hospital Stay (HOSPITAL_COMMUNITY): Payer: 59

## 2021-03-10 DIAGNOSIS — R12 Heartburn: Secondary | ICD-10-CM | POA: Insufficient documentation

## 2021-03-10 DIAGNOSIS — Z3A01 Less than 8 weeks gestation of pregnancy: Secondary | ICD-10-CM | POA: Insufficient documentation

## 2021-03-10 DIAGNOSIS — Z3490 Encounter for supervision of normal pregnancy, unspecified, unspecified trimester: Secondary | ICD-10-CM

## 2021-03-10 DIAGNOSIS — O26891 Other specified pregnancy related conditions, first trimester: Secondary | ICD-10-CM | POA: Diagnosis not present

## 2021-03-10 DIAGNOSIS — R109 Unspecified abdominal pain: Secondary | ICD-10-CM | POA: Diagnosis present

## 2021-03-10 LAB — CBC
HCT: 33.7 % — ABNORMAL LOW (ref 36.0–46.0)
Hemoglobin: 11.3 g/dL — ABNORMAL LOW (ref 12.0–15.0)
MCH: 30.1 pg (ref 26.0–34.0)
MCHC: 33.5 g/dL (ref 30.0–36.0)
MCV: 89.9 fL (ref 80.0–100.0)
Platelets: 278 10*3/uL (ref 150–400)
RBC: 3.75 MIL/uL — ABNORMAL LOW (ref 3.87–5.11)
RDW: 11.7 % (ref 11.5–15.5)
WBC: 5.8 10*3/uL (ref 4.0–10.5)
nRBC: 0 % (ref 0.0–0.2)

## 2021-03-10 LAB — URINALYSIS, ROUTINE W REFLEX MICROSCOPIC
Bilirubin Urine: NEGATIVE
Glucose, UA: NEGATIVE mg/dL
Hgb urine dipstick: NEGATIVE
Ketones, ur: NEGATIVE mg/dL
Leukocytes,Ua: NEGATIVE
Nitrite: NEGATIVE
Protein, ur: NEGATIVE mg/dL
Specific Gravity, Urine: 1.029 (ref 1.005–1.030)
pH: 7 (ref 5.0–8.0)

## 2021-03-10 LAB — HCG, QUANTITATIVE, PREGNANCY: hCG, Beta Chain, Quant, S: 46952 m[IU]/mL — ABNORMAL HIGH (ref <5)

## 2021-03-10 LAB — POCT PREGNANCY, URINE: Preg Test, Ur: POSITIVE — AB

## 2021-03-10 LAB — ABO/RH: ABO/RH(D): A POS

## 2021-03-10 MED ORDER — VITAFOL GUMMIES 3.33-0.333-34.8 MG PO CHEW: 1.0000 | CHEWABLE_TABLET | Freq: Every day | ORAL | 5 refills | Status: DC

## 2021-03-10 MED ORDER — PYRIDOXINE HCL 25 MG PO TABS: 25.0000 mg | ORAL_TABLET | Freq: Four times a day (QID) | ORAL | 3 refills | Status: DC | PRN

## 2021-03-10 MED ORDER — DOXYLAMINE SUCCINATE (SLEEP) 25 MG PO TABS: 25.0000 mg | ORAL_TABLET | Freq: Four times a day (QID) | ORAL | 2 refills | Status: DC | PRN

## 2021-03-10 NOTE — MAU Note (Signed)
Carla Haley is a 30 y.o. at [redacted]w[redacted]d here in MAU reporting: has been feeling fatigued and like her heart is beating fast. Has been feeling nauseated but no vomiting. Having upper abdominal pain. No bleeding or abnormal discharge.  LMP: 01/25/21  Onset of complaint: ongoing for a week  Pain score: 4/10  Vitals:   03/10/21 1339  BP: 99/80  Pulse: 76  Resp: 16  Temp: 98.1 F (36.7 C)  SpO2: 100%      Lab orders placed from triage: UPT, UA

## 2021-03-10 NOTE — MAU Provider Note (Signed)
History     CSN: [id number]  Arrival date and time: 03/10/21 1318   None     Chief Complaint  Patient presents with  . Fatigue  . Abdominal Pain   HPI   Patient is a g4P3 who presents to MAU with home positive pregnancy test, cramping, nausea. She reports that she has had multiple positive pregnancy tests and has been experiencing a lot of nausea and fatigue over the past two weeks. She also reports intermittent abdominal cramping that is mild in nature. Denies vaginal bleeding. She also endorses heart burn. Currently is not nauseous and is tolerating PO. This is a desired pregnancy. No other complaints.  OB History    Gravida  4   Para  3   Term  3   Preterm  0   AB  0   Living  3     SAB  0   IAB  0   Ectopic  0   Multiple  0   Live Births  3           Past Medical History:  Diagnosis Date  . Medical history non-contributory     Past Surgical History:  Procedure Laterality Date  . NO PAST SURGERIES      Family History  Problem Relation Age of Onset  . Hypertension Mother     Social History   Tobacco Use  . Smoking status: Never Smoker  . Smokeless tobacco: Never Used  Vaping Use  . Vaping Use: Never used  Substance Use Topics  . Alcohol use: No  . Drug use: No    Allergies: No Known Allergies  Medications Prior to Admission  Medication Sig Dispense Refill Last Dose  . acetaminophen (TYLENOL) 325 MG tablet Take 2 tablets (650 mg total) by mouth every 4 (four) hours as needed (for pain scale < 4). 30 tablet 0   . ibuprofen (ADVIL,MOTRIN) 600 MG tablet Take 1 tablet (600 mg total) by mouth every 6 (six) hours. 30 tablet 0   . Prenatal MV-Min-FA-Omega-3 (PRENATAL GUMMIES/DHA & FA) 0.4-32.5 MG CHEW Chew 1 tablet by mouth daily at 6 (six) AM. 30 tablet 12   . ranitidine (ZANTAC) 150 MG tablet Take 1 tablet (150 mg total) by mouth 2 (two) times daily. (Patient not taking: Reported on 05/23/2018) 60 tablet 3     Review of Systems   Constitutional: Positive for fatigue. Negative for activity change, appetite change, chills and fever.  Respiratory: Negative for chest tightness.   Cardiovascular: Negative for chest pain.  Gastrointestinal: Positive for abdominal pain and nausea. Negative for abdominal distention, diarrhea and vomiting.  Genitourinary: Negative for dysuria, flank pain and frequency.  Neurological: Negative for dizziness.   Physical Exam   Blood pressure (!) 97/58, pulse 81, temperature 98.4 F (36.9 C), temperature source Oral, resp. rate 18, height 5\' 3"  (1.6 m), weight 50.3 kg, last menstrual period 01/25/2021, SpO2 100 %, currently breastfeeding.  Physical Exam Vitals and nursing note reviewed.  Constitutional:      Appearance: She is well-developed.  Cardiovascular:     Rate and Rhythm: Normal rate.  Abdominal:     Palpations: Abdomen is soft.     Tenderness: There is abdominal tenderness in the right lower quadrant, epigastric area and left lower quadrant.  Skin:    General: Skin is warm and dry.  Neurological:     Mental Status: She is alert.     MAU Course  Procedures  MDM -Pt evaluated at bedside -  Physical exam notable for lower abdominal tenderness -Tolerating PO at bedside -CBC, ABO, HCG, [place] ordered -labs reviewed and are normal, US showed viable IUP at [redacted]w[redacted]d  -stable for d/c home    Assessment and Plan   Viable IUP at [redacted]w[redacted]d -script sent for doxylamine/b6, prenatal vitamins  -provided with list of OB providers -stable for dc home   Gita Kudo 03/10/2021, 2:44 PM

## 2021-04-18 ENCOUNTER — Other Ambulatory Visit: Payer: Self-pay | Admitting: Family Medicine

## 2021-04-18 ENCOUNTER — Telehealth (INDEPENDENT_AMBULATORY_CARE_PROVIDER_SITE_OTHER): Payer: 59

## 2021-04-18 DIAGNOSIS — O26891 Other specified pregnancy related conditions, first trimester: Secondary | ICD-10-CM

## 2021-04-18 DIAGNOSIS — Z3490 Encounter for supervision of normal pregnancy, unspecified, unspecified trimester: Secondary | ICD-10-CM

## 2021-04-18 DIAGNOSIS — Z3A00 Weeks of gestation of pregnancy not specified: Secondary | ICD-10-CM

## 2021-04-18 DIAGNOSIS — Z3480 Encounter for supervision of other normal pregnancy, unspecified trimester: Secondary | ICD-10-CM | POA: Insufficient documentation

## 2021-04-18 MED ORDER — VITAFOL GUMMIES 3.33-0.333-34.8 MG PO CHEW: 3.0000 | CHEWABLE_TABLET | Freq: Every day | ORAL | 5 refills | Status: DC

## 2021-04-18 MED ORDER — OMEPRAZOLE MAGNESIUM 20 MG PO TBEC: 20.0000 mg | DELAYED_RELEASE_TABLET | Freq: Every day | ORAL | 3 refills | Status: DC

## 2021-04-18 NOTE — Progress Notes (Signed)
New OB Intake  I connected with  Carla Haley on 04/18/21 at 10:15 AM EDT by MyChart Video Visit and verified that I am speaking with the correct person using two identifiers. Nurse is located at [facility] and pt is located at home.  I discussed the limitations, risks, security and privacy concerns of performing an evaluation and management service by telephone and the availability of in person appointments. I also discussed with the patient that there may be a patient responsible charge related to this service. The patient expressed understanding and agreed to proceed.  I explained I am completing New OB Intake today. We discussed her EDD of 11/01/21 that is based on LMP of 01/25/21. Pt is G4/P3. I reviewed her allergies, medications, Medical/Surgical/OB history, and appropriate screenings. I informed her of [facility] services. Based on history, this is a/an  pregnancy uncomplicated .   There are no problems to display for this patient.   Concerns addressed today  Delivery Plans:  Plans to deliver at [facility].   MyChart/Babyscripts MyChart access verified. I explained pt will have some visits in office and some virtually. Babyscripts instructions given and order placed. Patient verifies receipt of registration text/e-mail. Account successfully created and app downloaded.  Blood Pressure Cuff  Patient has private insurance; instructed to purchase blood pressure cuff and bring to first prenatal appt. Explained after first prenatal appt pt will check weekly and document in Babyscripts.  Weight scale: Patient    have weight scale. Weight scale ordered   Anatomy US Explained first scheduled [place] will be around 19 weeks. Anatomy US scheduled for 06/06/21 at 10:30. Pt notified to arrive at 10:15.  Labs Discussed Avelina Laine genetic screening with patient. Would like both Panorama and Horizon drawn at new OB visit. Routine prenatal labs needed.  Covid Vaccine Patient has covid vaccine.   Mother/ Baby  Dyad Candidate?    If yes, offer as possibility  Inform patient of [facility] and place . In AVS   Social Determinants of Health Food Insecurity: Patient denies food insecurity. WIC Referral: Patient is interested in referral to [facility].  Transportation: Patient denies transportation needs. Childcare: Discussed no children allowed at ultrasound appointments. Offered childcare services; patient declines childcare services at this time.  First visit review I reviewed new OB appt with pt. I explained she will have a pelvic exam, ob bloodwork with genetic screening, and PAP smear. Explained pt will be seen by Dr. Jolayne Panther at first visit; encounter routed to appropriate provider. Explained that patient will be seen by pregnancy navigator following visit with provider. [facility] information placed in AVS.   Carla Haley, CMA 04/18/2021  10:59 AM

## 2021-04-18 NOTE — Progress Notes (Signed)
Pt states is having a lot of Saliva, causing her to spit a lot, wants to know if there's anything that she can take to stop.

## 2021-04-28 ENCOUNTER — Other Ambulatory Visit: Payer: Self-pay

## 2021-04-28 ENCOUNTER — Ambulatory Visit (INDEPENDENT_AMBULATORY_CARE_PROVIDER_SITE_OTHER): Payer: 59 | Admitting: Medical

## 2021-04-28 ENCOUNTER — Encounter: Payer: Self-pay | Admitting: Medical

## 2021-04-28 ENCOUNTER — Other Ambulatory Visit (HOSPITAL_COMMUNITY)
Admission: RE | Admit: 2021-04-28 | Discharge: 2021-04-28 | Disposition: A | Discharge status: Home / Self Care | Payer: Medicaid Other | Source: Ambulatory Visit | Attending: Obstetrics and Gynecology | Admitting: Obstetrics and Gynecology

## 2021-04-28 VITALS — BP 100/62 | HR 78 | Wt 108.6 lb

## 2021-04-28 DIAGNOSIS — O26891 Other specified pregnancy related conditions, first trimester: Secondary | ICD-10-CM | POA: Insufficient documentation

## 2021-04-28 DIAGNOSIS — Z8759 Personal history of other complications of pregnancy, childbirth and the puerperium: Secondary | ICD-10-CM | POA: Insufficient documentation

## 2021-04-28 DIAGNOSIS — Z31430 Encounter of female for testing for genetic disease carrier status for procreative management: Secondary | ICD-10-CM | POA: Diagnosis not present

## 2021-04-28 DIAGNOSIS — N898 Other specified noninflammatory disorders of vagina: Secondary | ICD-10-CM | POA: Diagnosis not present

## 2021-04-28 DIAGNOSIS — O219 Vomiting of pregnancy, unspecified: Secondary | ICD-10-CM | POA: Diagnosis not present

## 2021-04-28 DIAGNOSIS — Z3481 Encounter for supervision of other normal pregnancy, first trimester: Secondary | ICD-10-CM | POA: Diagnosis not present

## 2021-04-28 DIAGNOSIS — Z3480 Encounter for supervision of other normal pregnancy, unspecified trimester: Secondary | ICD-10-CM | POA: Diagnosis not present

## 2021-04-28 MED ORDER — TERCONAZOLE 0.4 % VA CREA: 1.0000 | TOPICAL_CREAM | Freq: Every day | VAGINAL | 0 refills | Status: DC

## 2021-04-28 MED ORDER — BLOOD PRESSURE KIT DEVI: 1.0000 [IU] | 0 refills | Status: DC

## 2021-04-28 MED ORDER — GLYCOPYRROLATE 1 MG PO TABS: 1.0000 mg | ORAL_TABLET | Freq: Three times a day (TID) | ORAL | 3 refills | Status: DC

## 2021-04-28 MED ORDER — BLOOD PRESSURE KIT DEVI
1.0000 [IU] | 0 refills | Status: DC
Start: 2021-04-28 — End: 2021-08-21

## 2021-04-28 MED ORDER — GOJJI WEIGHT SCALE MISC
1.0000 [IU] | 0 refills | Status: DC
Start: 2021-04-28 — End: 2021-08-21

## 2021-04-28 MED ORDER — GOJJI WEIGHT SCALE MISC: 1.0000 [IU] | 0 refills | Status: DC

## 2021-04-28 NOTE — Patient Instructions (Signed)

## 2021-04-28 NOTE — Progress Notes (Signed)
   PRENATAL VISIT NOTE  Subjective:  Carla Haley is a 30 y.o. G4P3003 at 88w2dbeing seen today for her first prenatal visit for this pregnancy.  She is currently monitored for the following issues for this low-risk pregnancy and has Supervision of other normal pregnancy, antepartum and History of polyhydramnios on their problem list.  Patient reports headache, nausea and vomiting.  Contractions: Not present. Vag. Bleeding: None.  Movement: Absent. Denies leaking of fluid.   She is planning to breastfeed. Desires IUD for contraception.   The following portions of the patient's history were reviewed and updated as appropriate: allergies, current medications, past family history, past medical history, past social history, past surgical history and problem list.   Objective:   Vitals:   04/28/21 1420  BP: 100/62  Pulse: 78  Weight: 108 lb 9.6 oz (49.3 kg)    Fetal Status: Fetal Heart Rate (bpm): 152   Movement: Absent     General:  Alert, oriented and cooperative. Patient is in no acute distress.  Skin: Skin is warm and dry. No rash noted.   Cardiovascular: Normal heart rate and rhythm noted  Respiratory: Normal respiratory effort, no problems with respiration noted. Clear to auscultation.   Abdomen: Soft, gravid, appropriate for gestational age. Normal bowel sounds. Non-tender. Pain/Pressure: Absent     Pelvic: Cervical exam performed Dilation: Closed Effacement (%): Thick   Normal cervical contour, no lesions, no bleeding following pap, copious amount of thick, white discharge adherent to the vaginal walls consistent with yeast  Breast: declined  Extremities: Normal range of motion.     Mental Status: Normal mood and affect. Normal behavior. Normal judgment and thought content.   Assessment and Plan:  Pregnancy: G4P3003 at [redacted]w[redacted]d. Supervision of other normal pregnancy, antepartum - Cytology - PAP( [place]) - Hemoglobin A1c - Genetic Screening - Culture, OB Urine -  CBC/D/Plt+RPR+Rh+ABO+RubIgG... - Cervicovaginal ancillary only( [place]) - Blood Pressure Monitoring (BLOOD PRESSURE KIT) DEVI; 1 Units by Does not apply route once a week.  Dispense: 1 each; Refill: 0 - Misc. Devices (GOJJI WEIGHT SCALE) MISC; 1 Units by Does not apply route once a week.  Dispense: 1 each; Refill: 0  2. History of polyhydramnios  3. Vaginal discharge during pregnancy in first trimester - Cervicovaginal ancillary only( [place]) - terconazole (TERAZOL 7) 0.4 % vaginal cream; Place 1 applicator vaginally at bedtime.  Dispense: 45 g; Refill: 0  4. Nausea and vomiting during pregnancy prior to [redacted] weeks gestation - Continue Prilosec, B6 and Unisom - glycopyrrolate (ROBINUL) 1 MG tablet; Take 1 tablet (1 mg total) by mouth 3 (three) times daily.  Dispense: 90 tablet; Refill: 3  Preterm labor/first trimester warning symptoms and general obstetric precautions including but not limited to vaginal bleeding, contractions, leaking of fluid and fetal movement were reviewed in detail with the patient. Please refer to After Visit Summary for other counseling recommendations.   Discussed the normal visit cadence for prenatal care Discussed the nature of our practice with multiple providers including residents and students   Return in about 4 weeks (around 05/26/2021) for LOB, In-Person, any provider.  Future Appointments  Date Time Provider DeNez Perce8/03/2021 10:30 AM [facility]  JuKerry HoughPA-C

## 2021-04-29 LAB — CBC/D/PLT+RPR+RH+ABO+RUBIGG...
Antibody Screen: NEGATIVE
Basophils Absolute: 0 10*3/uL (ref 0.0–0.2)
Basos: 0 %
EOS (ABSOLUTE): 0.1 10*3/uL (ref 0.0–0.4)
Eos: 1 %
HCV Ab: <0.1 s/co ratio (ref 0.0–0.9)
HIV Screen 4th Generation wRfx: NONREACTIVE
Hematocrit: 34.5 % (ref 34.0–46.6)
Hemoglobin: 11.6 g/dL (ref 11.1–15.9)
Hepatitis B Surface Ag: NEGATIVE
Immature Grans (Abs): 0 10*3/uL (ref 0.0–0.1)
Immature Granulocytes: 1 %
Lymphocytes Absolute: 1.9 10*3/uL (ref 0.7–3.1)
Lymphs: 28 %
MCH: 30.2 pg (ref 26.6–33.0)
MCHC: 33.6 g/dL (ref 31.5–35.7)
MCV: 90 fL (ref 79–97)
Monocytes Absolute: 0.4 10*3/uL (ref 0.1–0.9)
Monocytes: 5 %
Neutrophils Absolute: 4.4 10*3/uL (ref 1.4–7.0)
Neutrophils: 65 %
Platelets: 341 10*3/uL (ref 150–450)
RBC: 3.84 x10E6/uL (ref 3.77–5.28)
RDW: 11.6 % — ABNORMAL LOW (ref 11.7–15.4)
RPR Ser Ql: NONREACTIVE
Rh Factor: POSITIVE
Rubella Antibodies, IGG: 13 index (ref >0.99)
WBC: 6.8 10*3/uL (ref 3.4–10.8)

## 2021-04-29 LAB — CERVICOVAGINAL ANCILLARY ONLY
Bacterial Vaginitis (gardnerella): POSITIVE — AB
Candida Glabrata: NEGATIVE
Candida Vaginitis: POSITIVE — AB
Chlamydia: NEGATIVE
Comment: NEGATIVE
Comment: NEGATIVE
Comment: NEGATIVE
Comment: NEGATIVE
Comment: NEGATIVE
Comment: NORMAL
Neisseria Gonorrhea: NEGATIVE
Trichomonas: NEGATIVE

## 2021-04-29 LAB — HEMOGLOBIN A1C
Est. average glucose Bld gHb Est-mCnc: 80 mg/dL
Hgb A1c MFr Bld: 4.4 % — ABNORMAL LOW (ref 4.8–5.6)

## 2021-04-29 LAB — HCV INTERPRETATION

## 2021-04-30 LAB — URINE CULTURE, OB REFLEX

## 2021-04-30 LAB — CULTURE, OB URINE

## 2021-04-30 LAB — CYTOLOGY - PAP
Comment: NEGATIVE
Diagnosis: UNDETERMINED — AB
High risk HPV: NEGATIVE

## 2021-05-01 ENCOUNTER — Encounter: Payer: Self-pay | Admitting: Medical

## 2021-05-01 DIAGNOSIS — R87610 Atypical squamous cells of undetermined significance on cytologic smear of cervix (ASC-US): Secondary | ICD-10-CM | POA: Insufficient documentation

## 2021-05-20 ENCOUNTER — Encounter: Payer: Self-pay | Admitting: *Deleted

## 2021-05-21 ENCOUNTER — Encounter: Payer: Self-pay | Admitting: *Deleted

## 2021-05-22 ENCOUNTER — Other Ambulatory Visit: Payer: Self-pay

## 2021-05-22 MED ORDER — PREPLUS 27-1 MG PO TABS: 1.0000 | ORAL_TABLET | Freq: Every day | ORAL | 8 refills | Status: DC

## 2021-05-22 NOTE — Telephone Encounter (Signed)
Received notification that pt's insurance does not cover gummy prenatal vitamins.  New Rx for prenatal vitamins e-prescribed.    Carla Haley  05/22/21

## 2021-05-30 ENCOUNTER — Other Ambulatory Visit: Payer: Self-pay

## 2021-05-30 ENCOUNTER — Ambulatory Visit (INDEPENDENT_AMBULATORY_CARE_PROVIDER_SITE_OTHER): Payer: Medicaid Other | Admitting: Obstetrics and Gynecology

## 2021-05-30 VITALS — BP 95/65 | HR 80 | Wt 115.4 lb

## 2021-05-30 DIAGNOSIS — O26891 Other specified pregnancy related conditions, first trimester: Secondary | ICD-10-CM

## 2021-05-30 DIAGNOSIS — Z3A17 17 weeks gestation of pregnancy: Secondary | ICD-10-CM

## 2021-05-30 DIAGNOSIS — N898 Other specified noninflammatory disorders of vagina: Secondary | ICD-10-CM

## 2021-05-30 DIAGNOSIS — Z3480 Encounter for supervision of other normal pregnancy, unspecified trimester: Secondary | ICD-10-CM

## 2021-05-30 DIAGNOSIS — R12 Heartburn: Secondary | ICD-10-CM

## 2021-05-30 DIAGNOSIS — O26899 Other specified pregnancy related conditions, unspecified trimester: Secondary | ICD-10-CM

## 2021-05-30 DIAGNOSIS — R102 Pelvic and perineal pain: Secondary | ICD-10-CM

## 2021-05-30 MED ORDER — TERCONAZOLE 0.4 % VA CREA: 1.0000 | TOPICAL_CREAM | Freq: Every day | VAGINAL | 0 refills | Status: DC

## 2021-05-30 MED ORDER — OMEPRAZOLE MAGNESIUM 20 MG PO TBEC: 20.0000 mg | DELAYED_RELEASE_TABLET | Freq: Every day | ORAL | 1 refills | Status: DC

## 2021-05-30 MED ORDER — PRENATAL 19 29-1 MG PO TABS: 1.0000 | ORAL_TABLET | Freq: Every day | ORAL | 1 refills | Status: DC

## 2021-05-30 MED ORDER — METRONIDAZOLE 500 MG PO TABS: 500.0000 mg | ORAL_TABLET | Freq: Two times a day (BID) | ORAL | 0 refills | Status: DC

## 2021-05-30 NOTE — Progress Notes (Signed)
   PRENATAL VISIT NOTE  Subjective:  Carla Haley is a 30 y.o. G4P3003 at [redacted]w[redacted]d being seen today for ongoing prenatal care.  She is currently monitored for the following issues for this low-risk pregnancy and has Supervision of other normal pregnancy, antepartum; History of polyhydramnios; and ASCUS of cervix with negative high risk HPV on their problem list.  Patient reports  some pelvic pressure. No pain or bleeding .  Contractions: Not present. Vag. Bleeding: None.  Movement: Present. Denies leaking of fluid.   The following portions of the patient's history were reviewed and updated as appropriate: allergies, current medications, past family history, past medical history, past social history, past surgical history and problem list.   Objective:   Vitals:   05/30/21 1112  BP: 95/65  Pulse: 80  Weight: 115 lb 6.4 oz (52.3 kg)    Fetal Status: Fetal Heart Rate (bpm): 149   Movement: Present     General:  Alert, oriented and cooperative. Patient is in no acute distress.  Skin: Skin is warm and dry. No rash noted.   Cardiovascular: Normal heart rate noted  Respiratory: Normal respiratory effort, no problems with respiration noted  Abdomen: Soft, gravid, appropriate for gestational age.  Pain/Pressure: Present     Pelvic: Cervical exam deferred        Extremities: Normal range of motion.     Mental Status: Normal mood and affect. Normal behavior. Normal judgment and thought content.   Assessment and Plan:  Pregnancy: G4P3003 at [redacted]w[redacted]d 1. Vaginal odor Pt had yeast and bv at her nob but only treated for yeast which helped but s/s came back a week later. Will treat for both  2. [redacted] weeks gestation of pregnancy F/u anatomy u/s next week - AFP, Serum, Open Spina Bifida  3. Heartburn during pregnancy in first trimester - omeprazole (PRILOSEC OTC) 20 MG tablet; Take 1 tablet (20 mg total) by mouth daily.  Dispense: 90 tablet; Refill: 1  4. Vaginal discharge during pregnancy in first  trimester - terconazole (TERAZOL 7) 0.4 % vaginal cream; Place 1 applicator vaginally at bedtime.  Dispense: 45 g; Refill: 0  5. Supervision of other normal pregnancy, antepartum  6. Pelvic pressure in pregnancy Recommend pregnancy belt. ED precautions given  Preterm labor symptoms and general obstetric precautions including but not limited to vaginal bleeding, contractions, leaking of fluid and fetal movement were reviewed in detail with the patient. Please refer to After Visit Summary for other counseling recommendations.   Return in about 1 month (around 06/30/2021) for 4-5 weeks, low risk ob, md or app, in person.  Future Appointments  Date Time Provider Department Center  06/06/2021 10:30 AM [facility] US3 [facility]     Bing, MD

## 2021-06-05 ENCOUNTER — Other Ambulatory Visit: Payer: Self-pay

## 2021-06-05 DIAGNOSIS — O26891 Other specified pregnancy related conditions, first trimester: Secondary | ICD-10-CM

## 2021-06-05 DIAGNOSIS — N898 Other specified noninflammatory disorders of vagina: Secondary | ICD-10-CM

## 2021-06-05 DIAGNOSIS — R12 Heartburn: Secondary | ICD-10-CM

## 2021-06-05 MED ORDER — PRENATAL 19 29-1 MG PO TABS: 1.0000 | ORAL_TABLET | Freq: Every day | ORAL | 1 refills | Status: DC

## 2021-06-05 MED ORDER — METRONIDAZOLE 500 MG PO TABS: 500.0000 mg | ORAL_TABLET | Freq: Two times a day (BID) | ORAL | 0 refills | Status: AC

## 2021-06-05 MED ORDER — OMEPRAZOLE MAGNESIUM 20 MG PO TBEC: 20.0000 mg | DELAYED_RELEASE_TABLET | Freq: Every day | ORAL | 1 refills | Status: DC

## 2021-06-05 MED ORDER — TERCONAZOLE 0.4 % VA CREA: 1.0000 | TOPICAL_CREAM | Freq: Every day | VAGINAL | 0 refills | Status: DC

## 2021-06-05 NOTE — Progress Notes (Signed)
Pt called and asked all Rx from visit on 7/29 be [facility] to [facility]. All 4 [facility] [address]   Corrigan, RN

## 2021-06-06 ENCOUNTER — Other Ambulatory Visit: Payer: Self-pay

## 2021-06-06 ENCOUNTER — Ambulatory Visit: Payer: Medicaid Other | Attending: Obstetrics and Gynecology

## 2021-06-06 ENCOUNTER — Other Ambulatory Visit: Payer: Self-pay | Admitting: *Deleted

## 2021-06-06 DIAGNOSIS — Z363 Encounter for antenatal screening for malformations: Secondary | ICD-10-CM | POA: Diagnosis not present

## 2021-06-06 DIAGNOSIS — Z3A18 18 weeks gestation of pregnancy: Secondary | ICD-10-CM

## 2021-06-06 DIAGNOSIS — O43192 Other malformation of placenta, second trimester: Secondary | ICD-10-CM

## 2021-06-06 DIAGNOSIS — Z3480 Encounter for supervision of other normal pregnancy, unspecified trimester: Secondary | ICD-10-CM | POA: Diagnosis present

## 2021-06-07 ENCOUNTER — Encounter: Payer: Self-pay | Admitting: Obstetrics & Gynecology

## 2021-06-07 DIAGNOSIS — O43199 Other malformation of placenta, unspecified trimester: Secondary | ICD-10-CM | POA: Insufficient documentation

## 2021-06-09 LAB — AFP, SERUM, OPEN SPINA BIFIDA
AFP MoM: 1.29
AFP Value: 76.4 ng/mL
Gest. Age on Collection Date: 17.9 weeks
Maternal Age At EDD: 29.5 yr
OSBR Risk 1 IN: 9894
Test Results:: NEGATIVE
Weight: 115 [lb_av]

## 2021-06-27 ENCOUNTER — Encounter: Payer: Medicaid Other | Admitting: Family Medicine

## 2021-07-04 ENCOUNTER — Encounter: Payer: Medicaid Other | Admitting: Family Medicine

## 2021-07-10 ENCOUNTER — Ambulatory Visit: Payer: Medicaid Other

## 2021-07-21 ENCOUNTER — Other Ambulatory Visit: Payer: Self-pay

## 2021-07-21 ENCOUNTER — Ambulatory Visit: Payer: Medicaid Other | Attending: Pediatrics | Admitting: *Deleted

## 2021-07-21 ENCOUNTER — Encounter: Payer: Self-pay | Admitting: *Deleted

## 2021-07-21 ENCOUNTER — Ambulatory Visit (HOSPITAL_BASED_OUTPATIENT_CLINIC_OR_DEPARTMENT_OTHER): Payer: Medicaid Other

## 2021-07-21 VITALS — BP 107/52 | HR 75

## 2021-07-21 DIAGNOSIS — Z3A25 25 weeks gestation of pregnancy: Secondary | ICD-10-CM | POA: Insufficient documentation

## 2021-07-21 DIAGNOSIS — O43192 Other malformation of placenta, second trimester: Secondary | ICD-10-CM | POA: Diagnosis not present

## 2021-07-21 DIAGNOSIS — Z363 Encounter for antenatal screening for malformations: Secondary | ICD-10-CM | POA: Insufficient documentation

## 2021-07-21 DIAGNOSIS — Z362 Encounter for other antenatal screening follow-up: Secondary | ICD-10-CM

## 2021-07-21 DIAGNOSIS — Z3480 Encounter for supervision of other normal pregnancy, unspecified trimester: Secondary | ICD-10-CM

## 2021-07-22 ENCOUNTER — Other Ambulatory Visit: Payer: Self-pay | Admitting: *Deleted

## 2021-07-22 DIAGNOSIS — O43199 Other malformation of placenta, unspecified trimester: Secondary | ICD-10-CM

## 2021-08-18 ENCOUNTER — Ambulatory Visit: Payer: Medicaid Other | Admitting: *Deleted

## 2021-08-18 ENCOUNTER — Other Ambulatory Visit: Payer: Self-pay | Admitting: *Deleted

## 2021-08-18 ENCOUNTER — Other Ambulatory Visit: Payer: Self-pay

## 2021-08-18 ENCOUNTER — Encounter: Payer: Self-pay | Admitting: *Deleted

## 2021-08-18 ENCOUNTER — Ambulatory Visit: Payer: Medicaid Other | Attending: Obstetrics

## 2021-08-18 VITALS — BP 95/57 | HR 97

## 2021-08-18 DIAGNOSIS — O43199 Other malformation of placenta, unspecified trimester: Secondary | ICD-10-CM | POA: Insufficient documentation

## 2021-08-18 DIAGNOSIS — Z3A29 29 weeks gestation of pregnancy: Secondary | ICD-10-CM

## 2021-08-18 DIAGNOSIS — Z3480 Encounter for supervision of other normal pregnancy, unspecified trimester: Secondary | ICD-10-CM

## 2021-08-18 DIAGNOSIS — O43193 Other malformation of placenta, third trimester: Secondary | ICD-10-CM

## 2021-08-20 ENCOUNTER — Other Ambulatory Visit: Payer: Self-pay

## 2021-08-20 DIAGNOSIS — Z3480 Encounter for supervision of other normal pregnancy, unspecified trimester: Secondary | ICD-10-CM

## 2021-08-21 ENCOUNTER — Encounter: Payer: Self-pay | Admitting: Family Medicine

## 2021-08-21 ENCOUNTER — Other Ambulatory Visit: Payer: Medicaid Other

## 2021-08-21 ENCOUNTER — Other Ambulatory Visit: Payer: Self-pay

## 2021-08-21 ENCOUNTER — Ambulatory Visit (INDEPENDENT_AMBULATORY_CARE_PROVIDER_SITE_OTHER): Payer: Medicaid Other | Admitting: Family Medicine

## 2021-08-21 VITALS — BP 103/69 | HR 94 | Wt 137.9 lb

## 2021-08-21 DIAGNOSIS — Z23 Encounter for immunization: Secondary | ICD-10-CM | POA: Diagnosis not present

## 2021-08-21 DIAGNOSIS — O43199 Other malformation of placenta, unspecified trimester: Secondary | ICD-10-CM

## 2021-08-21 DIAGNOSIS — Z3480 Encounter for supervision of other normal pregnancy, unspecified trimester: Secondary | ICD-10-CM

## 2021-08-21 NOTE — Progress Notes (Signed)
   Subjective:  Carla Haley is a 30 y.o. G4P3003 at [redacted]w[redacted]d being seen today for ongoing prenatal care.  She is currently monitored for the following issues for this high-risk pregnancy and has Supervision of other normal pregnancy, antepartum; History of polyhydramnios; ASCUS of cervix with negative high risk HPV; and Marginal insertion of umbilical cord affecting management of mother on their problem list.  Patient reports no complaints.  Contractions: Irritability. Vag. Bleeding: None.  Movement: Present. Denies leaking of fluid.   The following portions of the patient's history were reviewed and updated as appropriate: allergies, current medications, past family history, past medical history, past social history, past surgical history and problem list. Problem list updated.  Objective:   Vitals:   08/21/21 1155  BP: 103/69  Pulse: 94  Weight: 137 lb 14.4 oz (62.6 kg)    Fetal Status: Fetal Heart Rate (bpm): 141   Movement: Present     General:  Alert, oriented and cooperative. Patient is in no acute distress.  Skin: Skin is warm and dry. No rash noted.   Cardiovascular: Normal heart rate noted  Respiratory: Normal respiratory effort, no problems with respiration noted  Abdomen: Soft, gravid, appropriate for gestational age. Pain/Pressure: Present     Pelvic: Vag. Bleeding: None     Cervical exam deferred        Extremities: Normal range of motion.     Mental Status: Normal mood and affect. Normal behavior. Normal judgment and thought content.   Urinalysis:      Assessment and Plan:  Pregnancy: G4P3003 at [redacted]w[redacted]d  1. Supervision of other normal pregnancy, antepartum BP and FHR normal TDaP accepted today 28wk labs today - Tdap vaccine greater than or equal to 7yo IM  2. Marginal insertion of umbilical cord affecting management of mother Following w MFM Growths have been normal to date  Preterm labor symptoms and general obstetric precautions including but not limited to  vaginal bleeding, contractions, leaking of fluid and fetal movement were reviewed in detail with the patient. Please refer to After Visit Summary for other counseling recommendations.  Return in 2 weeks (on 09/04/2021) for [facility], ob visit.   Venora Maples, MD

## 2021-08-21 NOTE — Patient Instructions (Signed)

## 2021-08-22 LAB — CBC
Hematocrit: 32.6 % — ABNORMAL LOW (ref 34.0–46.6)
Hemoglobin: 10.5 g/dL — ABNORMAL LOW (ref 11.1–15.9)
MCH: 27.9 pg (ref 26.6–33.0)
MCHC: 32.2 g/dL (ref 31.5–35.7)
MCV: 87 fL (ref 79–97)
Platelets: 271 10*3/uL (ref 150–450)
RBC: 3.77 x10E6/uL (ref 3.77–5.28)
RDW: 12.6 % (ref 11.7–15.4)
WBC: 7.4 10*3/uL (ref 3.4–10.8)

## 2021-08-22 LAB — RPR: RPR Ser Ql: NONREACTIVE

## 2021-08-22 LAB — GLUCOSE TOLERANCE, 2 HOURS W/ 1HR
Glucose, 1 hour: 116 mg/dL (ref 70–179)
Glucose, 2 hour: 87 mg/dL (ref 70–152)
Glucose, Fasting: 71 mg/dL (ref 70–91)

## 2021-08-22 LAB — HIV ANTIBODY (ROUTINE TESTING W REFLEX): HIV Screen 4th Generation wRfx: NONREACTIVE

## 2021-09-08 ENCOUNTER — Ambulatory Visit (INDEPENDENT_AMBULATORY_CARE_PROVIDER_SITE_OTHER): Payer: Medicaid Other | Admitting: Obstetrics & Gynecology

## 2021-09-08 ENCOUNTER — Other Ambulatory Visit: Payer: Self-pay

## 2021-09-08 VITALS — BP 100/68 | HR 99 | Wt 140.0 lb

## 2021-09-08 DIAGNOSIS — Z3480 Encounter for supervision of other normal pregnancy, unspecified trimester: Secondary | ICD-10-CM

## 2021-09-08 DIAGNOSIS — O43199 Other malformation of placenta, unspecified trimester: Secondary | ICD-10-CM

## 2021-09-08 MED ORDER — FERROUS SULFATE 325 (65 FE) MG PO TABS: 325.0000 mg | ORAL_TABLET | ORAL | 3 refills | Status: DC

## 2021-09-08 NOTE — Progress Notes (Signed)
   PRENATAL VISIT NOTE  Subjective:  Carla Haley is a 30 y.o. G4P3003 at [redacted]w[redacted]d being seen today for ongoing prenatal care.  She is currently monitored for the following issues for this high-risk pregnancy and has Supervision of other normal pregnancy, antepartum; History of polyhydramnios; ASCUS of cervix with negative high risk HPV; and Marginal insertion of umbilical cord affecting management of mother on their problem list.  Patient reports  episodes of mild confusion, memory problem, dizzy, no headache no tachycardia, occasional mild SOB .  Contractions: Irritability. Vag. Bleeding: None.  Movement: Present. Denies leaking of fluid.   The following portions of the patient's history were reviewed and updated as appropriate: allergies, current medications, past family history, past medical history, past social history, past surgical history and problem list.   Objective:   Vitals:   09/08/21 1417  BP: 100/68  Pulse: 99  Weight: 140 lb (63.5 kg)    Fetal Status: Fetal Heart Rate (bpm): 137   Movement: Present     General:  Alert, oriented and cooperative. Patient is in no acute distress.  Skin: Skin is warm and dry. No rash noted.   Cardiovascular: Normal heart rate noted  Respiratory: Normal respiratory effort, no problems with respiration noted  Abdomen: Soft, gravid, appropriate for gestational age.  Pain/Pressure: Present     Pelvic: Cervical exam deferred        Extremities: Normal range of motion.  Edema: Trace  Mental Status: Normal mood and affect. Normal behavior. Normal judgment and thought content.   Assessment and Plan:  Pregnancy: G4P3003 at [redacted]w[redacted]d 1. Supervision of other normal pregnancy, antepartum Mild anemia - ferrous sulfate 325 (65 FE) MG tablet; Take 1 tablet (325 mg total) by mouth every other day.  Dispense: 30 tablet; Refill: 3 - TSH Weakness and memory proble 2. Marginal insertion of umbilical cord affecting management of mother F/u growth [place] 2  weeks  Preterm labor symptoms and general obstetric precautions including but not limited to vaginal bleeding, contractions, leaking of fluid and fetal movement were reviewed in detail with the patient. Please refer to After Visit Summary for other counseling recommendations.   Return in about 1 week (around 09/15/2021). If not better consider referral  Future Appointments  Date Time Provider Department Center  09/23/2021  2:30 PM [facility] NURSE [facility]  09/23/2021  2:45 PM [facility] US5 [facility]    Scheryl Darter, MD

## 2021-09-09 LAB — TSH: TSH: 1.42 u[IU]/mL (ref 0.450–4.500)

## 2021-09-16 ENCOUNTER — Other Ambulatory Visit: Payer: Self-pay

## 2021-09-16 ENCOUNTER — Ambulatory Visit (INDEPENDENT_AMBULATORY_CARE_PROVIDER_SITE_OTHER): Payer: Medicaid Other | Admitting: Family Medicine

## 2021-09-16 VITALS — BP 111/63 | HR 88 | Wt 144.0 lb

## 2021-09-16 DIAGNOSIS — O43199 Other malformation of placenta, unspecified trimester: Secondary | ICD-10-CM

## 2021-09-16 DIAGNOSIS — Z3480 Encounter for supervision of other normal pregnancy, unspecified trimester: Secondary | ICD-10-CM

## 2021-09-16 NOTE — Progress Notes (Signed)
   PRENATAL VISIT NOTE  Subjective:  Carla Haley is a 30 y.o. G4P3003 at [redacted]w[redacted]d being seen today for ongoing prenatal care.  She is currently monitored for the following issues for this high-risk pregnancy and has Supervision of other normal pregnancy, antepartum; History of polyhydramnios; ASCUS of cervix with negative high risk HPV; and Marginal insertion of umbilical cord affecting management of mother on their problem list.  Patient reports contractions since irregularly .  Contractions: Irritability. Vag. Bleeding: None.  Movement: Present. Denies leaking of fluid.   The following portions of the patient's history were reviewed and updated as appropriate: allergies, current medications, past family history, past medical history, past social history, past surgical history and problem list.   Objective:   Vitals:   09/16/21 0941  BP: 111/63  Pulse: 88  Weight: 144 lb (65.3 kg)    Fetal Status: Fetal Heart Rate (bpm): 131 Fundal Height: 34 cm Movement: Present     General:  Alert, oriented and cooperative. Patient is in no acute distress.  Skin: Skin is warm and dry. No rash noted.   Cardiovascular: Normal heart rate noted  Respiratory: Normal respiratory effort, no problems with respiration noted  Abdomen: Soft, gravid, appropriate for gestational age.  Pain/Pressure: Present     Pelvic: Cervical exam performed in the presence of a chaperone Dilation: 1 Effacement (%): Thick Station: Ballotable  Extremities: Normal range of motion.  Edema: None  Mental Status: Normal mood and affect. Normal behavior. Normal judgment and thought content.   Assessment and Plan:  Pregnancy: G4P3003 at [redacted]w[redacted]d 1. Supervision of other normal pregnancy, antepartum PTL precautions  2. Marginal insertion of umbilical cord affecting management of mother Last EFW 72%, 1537 gms, vtx, normal fluid F/u scheduled next week  Preterm labor symptoms and general obstetric precautions including but not limited  to vaginal bleeding, contractions, leaking of fluid and fetal movement were reviewed in detail with the patient. Please refer to After Visit Summary for other counseling recommendations.   Return in 2 weeks (on 09/30/2021).  Future Appointments  Date Time Provider Department Center  09/23/2021  2:30 PM [facility] NURSE [facility]  09/23/2021  2:45 PM [facility] US5 [facility]  10/01/2021 10:15 AM Bernerd Limbo, CNM [facility]    Reva Bores, MD

## 2021-09-16 NOTE — Patient Instructions (Signed)

## 2021-09-18 ENCOUNTER — Ambulatory Visit: Payer: Medicaid Other

## 2021-09-23 ENCOUNTER — Encounter: Payer: Self-pay | Admitting: *Deleted

## 2021-09-23 ENCOUNTER — Ambulatory Visit: Payer: Medicaid Other | Attending: Obstetrics and Gynecology

## 2021-09-23 ENCOUNTER — Other Ambulatory Visit: Payer: Self-pay

## 2021-09-23 ENCOUNTER — Ambulatory Visit: Payer: Medicaid Other | Admitting: *Deleted

## 2021-09-23 VITALS — BP 104/62 | HR 97

## 2021-09-23 DIAGNOSIS — O43193 Other malformation of placenta, third trimester: Secondary | ICD-10-CM | POA: Diagnosis not present

## 2021-09-23 DIAGNOSIS — Z3A34 34 weeks gestation of pregnancy: Secondary | ICD-10-CM

## 2021-09-23 DIAGNOSIS — Z3480 Encounter for supervision of other normal pregnancy, unspecified trimester: Secondary | ICD-10-CM | POA: Diagnosis present

## 2021-09-24 ENCOUNTER — Other Ambulatory Visit: Payer: Self-pay | Admitting: *Deleted

## 2021-09-24 DIAGNOSIS — O43193 Other malformation of placenta, third trimester: Secondary | ICD-10-CM

## 2021-09-24 DIAGNOSIS — O3663X1 Maternal care for excessive fetal growth, third trimester, fetus 1: Secondary | ICD-10-CM

## 2021-10-01 ENCOUNTER — Other Ambulatory Visit: Payer: Self-pay

## 2021-10-01 ENCOUNTER — Ambulatory Visit (INDEPENDENT_AMBULATORY_CARE_PROVIDER_SITE_OTHER): Payer: Medicaid Other | Admitting: Certified Nurse Midwife

## 2021-10-01 VITALS — BP 107/65 | HR 96 | Wt 148.2 lb

## 2021-10-01 DIAGNOSIS — O0993 Supervision of high risk pregnancy, unspecified, third trimester: Secondary | ICD-10-CM

## 2021-10-01 DIAGNOSIS — O219 Vomiting of pregnancy, unspecified: Secondary | ICD-10-CM

## 2021-10-01 DIAGNOSIS — Z3A35 35 weeks gestation of pregnancy: Secondary | ICD-10-CM

## 2021-10-01 MED ORDER — ONDANSETRON 4 MG PO TBDP: 4.0000 mg | ORAL_TABLET | Freq: Three times a day (TID) | ORAL | 0 refills | Status: DC | PRN

## 2021-10-02 ENCOUNTER — Inpatient Hospital Stay (HOSPITAL_COMMUNITY)
Admission: AD | Admit: 2021-10-02 | Discharge: 2021-10-02 | Disposition: A | Discharge status: Home / Self Care | Payer: Medicaid Other | Attending: Obstetrics and Gynecology | Admitting: Obstetrics and Gynecology

## 2021-10-02 ENCOUNTER — Encounter (HOSPITAL_COMMUNITY): Payer: Self-pay | Admitting: Obstetrics and Gynecology

## 2021-10-02 ENCOUNTER — Other Ambulatory Visit: Payer: Self-pay

## 2021-10-02 DIAGNOSIS — Z3689 Encounter for other specified antenatal screening: Secondary | ICD-10-CM

## 2021-10-02 DIAGNOSIS — Z3A35 35 weeks gestation of pregnancy: Secondary | ICD-10-CM

## 2021-10-02 DIAGNOSIS — O219 Vomiting of pregnancy, unspecified: Secondary | ICD-10-CM

## 2021-10-02 DIAGNOSIS — O26899 Other specified pregnancy related conditions, unspecified trimester: Secondary | ICD-10-CM

## 2021-10-02 DIAGNOSIS — R102 Pelvic and perineal pain: Secondary | ICD-10-CM | POA: Diagnosis present

## 2021-10-02 DIAGNOSIS — O4703 False labor before 37 completed weeks of gestation, third trimester: Secondary | ICD-10-CM

## 2021-10-02 DIAGNOSIS — O99891 Other specified diseases and conditions complicating pregnancy: Secondary | ICD-10-CM | POA: Insufficient documentation

## 2021-10-02 LAB — URINALYSIS, ROUTINE W REFLEX MICROSCOPIC
Bilirubin Urine: NEGATIVE
Glucose, UA: NEGATIVE mg/dL
Hgb urine dipstick: NEGATIVE
Ketones, ur: NEGATIVE mg/dL
Nitrite: NEGATIVE
Protein, ur: NEGATIVE mg/dL
Specific Gravity, Urine: 1.01 (ref 1.005–1.030)
pH: 7 (ref 5.0–8.0)

## 2021-10-02 LAB — URINALYSIS, MICROSCOPIC (REFLEX): Bacteria, UA: NONE SEEN

## 2021-10-02 MED ORDER — CYCLOBENZAPRINE HCL 10 MG PO TABS: 10.0000 mg | ORAL_TABLET | Freq: Three times a day (TID) | ORAL | 1 refills | Status: DC | PRN

## 2021-10-02 MED ORDER — ONDANSETRON 4 MG PO TBDP: 4.0000 mg | ORAL_TABLET | Freq: Three times a day (TID) | ORAL | 0 refills | Status: DC | PRN

## 2021-10-02 NOTE — MAU Note (Signed)
Carla Haley is a 30 y.o. at [redacted]w[redacted]d here in MAU reporting: sharp vaginal pain and some contractions since last week but got worse last night. Contractions come every few minutes. No bleeding or LOF. +FM  Onset of complaint: ongoing but worse   Pain score: vaginal pain 8/10, contractions 6/10  Vitals:   10/02/21 1228  BP: 106/66  Pulse: 93  Resp: 16  Temp: 98.7 F (37.1 C)  SpO2: 97%     FHT: +FM  Lab orders placed from triage: UA

## 2021-10-02 NOTE — Discharge Instructions (Signed)

## 2021-10-02 NOTE — MAU Provider Note (Signed)
Event Date/Time   First Provider Initiated Contact with Patient 10/02/21 1322     S: [name] is a 30 y.o. [phone] at [redacted]w[redacted]d  who presents to MAU today complaining of lower pelvic pain that is aggravated by movement. She does not think she's in labor but her FOB made her come to make sure. She denies vaginal bleeding. She denies LOF. She reports normal fetal movement.    O: BP 106/66 (BP Location: Right Arm)   Pulse 93   Temp 98.7 F (37.1 C) (Oral)   Resp 16   Ht 5\' 3"  (1.6 m)   Wt 147 lb 12.8 oz (67 kg)   LMP 01/25/2021   SpO2 97% Comment: room air  BMI 26.18 kg/m  GENERAL: Well-developed, well-nourished female in no acute distress.  HEAD: Normocephalic, atraumatic.  CHEST: Normal effort of breathing, regular heart rate ABDOMEN: Soft, nontender, gravid  Cervical exam:  Dilation: 1.5 Effacement (%): Thick Cervical Position: Posterior Station: Ballotable Presentation: Vertex Exam by:: J.Tylin Stradley,CNM (Unchanged from last office exam)  Fetal Monitoring: reactive Baseline: 145 Variability: moderate Accelerations: 15x15 Decelerations: occasional variable Contractions: q63min irregular and pt states they are mild  A: SIUP at [redacted]w[redacted]d  False labor with reactive NST  Round ligament pain  P: Discharge home in stable condition with term labor precautions - Flexeril sent to pharmacy  Follow up at [facility] as scheduled for ongoing prenatal care  [facility], CNM 10/02/2021 1:32 PM

## 2021-10-05 NOTE — Progress Notes (Signed)
   PRENATAL VISIT NOTE  Subjective:  Carla Haley is a 30 y.o. G4P3003 at [redacted]w[redacted]d being seen today for ongoing prenatal care.  She is currently monitored for the following issues for this low-risk pregnancy and has Supervision of other normal pregnancy, antepartum; History of polyhydramnios; ASCUS of cervix with negative high risk HPV; and Marginal insertion of umbilical cord affecting management of mother on their problem list.  Patient reports occasional contractions.  Contractions: Irregular. Vag. Bleeding: None.  Movement: Present. Denies leaking of fluid.   The following portions of the patient's history were reviewed and updated as appropriate: allergies, current medications, past family history, past medical history, past social history, past surgical history and problem list.   Objective:   Vitals:   10/01/21 1020  BP: 107/65  Pulse: 96  Weight: 148 lb 3.2 oz (67.2 kg)    Fetal Status: Fetal Heart Rate (bpm): 155 Fundal Height: 36 cm Movement: Present  Presentation: Vertex  General:  Alert, oriented and cooperative. Patient is in no acute distress.  Skin: Skin is warm and dry. No rash noted.   Cardiovascular: Normal heart rate noted  Respiratory: Normal respiratory effort, no problems with respiration noted  Abdomen: Soft, gravid, appropriate for gestational age.  Pain/Pressure: Present     Pelvic: Cervical exam deferred        Extremities: Normal range of motion.  Edema: Trace  Mental Status: Normal mood and affect. Normal behavior. Normal judgment and thought content.   Assessment and Plan:  Pregnancy: G4P3003 at [redacted]w[redacted]d 1. Supervision of high risk pregnancy in third trimester - Doing well, feeling regular and vigorous fetal movement   2. [redacted] weeks gestation of pregnancy - Routine OB care including anticipatory guidance re GBS testing at next visit  3. Nausea and vomiting during pregnancy - refill zofran sent to pharmacy  Preterm labor symptoms and general obstetric  precautions including but not limited to vaginal bleeding, contractions, leaking of fluid and fetal movement were reviewed in detail with the patient. Please refer to After Visit Summary for other counseling recommendations.   Return in about 1 week (around 10/08/2021) for IN-PERSON, LOB/GBS.  Future Appointments  Date Time Provider Department Center  10/13/2021 10:55 AM [name], MD [facility]  10/21/2021  3:00 PM [facility] NURSE [facility]  10/21/2021  3:15 PM [facility] US2 [facility]    Bernerd Limbo, CNM

## 2021-10-13 ENCOUNTER — Other Ambulatory Visit: Payer: Self-pay

## 2021-10-13 ENCOUNTER — Ambulatory Visit (INDEPENDENT_AMBULATORY_CARE_PROVIDER_SITE_OTHER): Payer: Medicaid Other | Admitting: Obstetrics and Gynecology

## 2021-10-13 ENCOUNTER — Other Ambulatory Visit (HOSPITAL_COMMUNITY)
Admission: RE | Admit: 2021-10-13 | Discharge: 2021-10-13 | Disposition: A | Discharge status: Home / Self Care | Payer: Medicaid Other | Source: Ambulatory Visit | Attending: Obstetrics and Gynecology | Admitting: Obstetrics and Gynecology

## 2021-10-13 ENCOUNTER — Encounter: Payer: Self-pay | Admitting: Obstetrics and Gynecology

## 2021-10-13 VITALS — BP 104/67 | HR 107 | Wt 153.7 lb

## 2021-10-13 DIAGNOSIS — Z3480 Encounter for supervision of other normal pregnancy, unspecified trimester: Secondary | ICD-10-CM | POA: Insufficient documentation

## 2021-10-13 DIAGNOSIS — O43199 Other malformation of placenta, unspecified trimester: Secondary | ICD-10-CM

## 2021-10-13 LAB — OB RESULTS CONSOLE GC/CHLAMYDIA: Gonorrhea: NEGATIVE

## 2021-10-13 NOTE — Progress Notes (Signed)
   PRENATAL VISIT NOTE  Subjective:  Carla Haley is a 30 y.o. G4P3003 at [redacted]w[redacted]d being seen today for ongoing prenatal care.  She is currently monitored for the following issues for this low-risk pregnancy and has Supervision of other normal pregnancy, antepartum; History of polyhydramnios; ASCUS of cervix with negative high risk HPV; and Marginal insertion of umbilical cord affecting management of mother on their problem list.  Patient reports  irregular ctxns .  Contractions: Irritability. Vag. Bleeding: None.  Movement: Present. Denies leaking of fluid.   The following portions of the patient's history were reviewed and updated as appropriate: allergies, current medications, past family history, past medical history, past social history, past surgical history and problem list.   Objective:   Vitals:   10/13/21 1122  BP: 104/67  Pulse: (!) 107  Weight: 153 lb 11.2 oz (69.7 kg)    Fetal Status: Fetal Heart Rate (bpm): 155 Fundal Height: 37 cm Movement: Present  Presentation: Vertex  General:  Alert, oriented and cooperative. Patient is in no acute distress.  Skin: Skin is warm and dry. No rash noted.   Cardiovascular: Normal heart rate noted  Respiratory: Normal respiratory effort, no problems with respiration noted  Abdomen: Soft, gravid, appropriate for gestational age.  Pain/Pressure: Present     Pelvic: Cervical exam performed in the presence of a chaperone Dilation: 3 Effacement (%): 60 Station: -2  Extremities: Normal range of motion.  Edema: None  Mental Status: Normal mood and affect. Normal behavior. Normal judgment and thought content.   Assessment and Plan:  Pregnancy: G4P3003 at [redacted]w[redacted]d 1. Supervision of other normal pregnancy, antepartum - She will want IOL at 39 weeks if still pregnant. She had one IOL at 39w and then spontaneous labor at 37w and 38w.  - GC/Chlamydia probe amp ([place])not at [facility] - Culture, beta strep (group b only)  2. Marginal insertion of  umbilical cord affecting management of mother - Growth is 98%ile on 11/22. She has h/o 8+ lb baby and h/o poly. DM testing was normal this pregnancy.  - Next growth is 12/20.   Term labor symptoms and general obstetric precautions including but not limited to vaginal bleeding, contractions, leaking of fluid and fetal movement were reviewed in detail with the patient. Please refer to After Visit Summary for other counseling recommendations.   Return in about 1 week (around 10/20/2021) for OB VISIT, MD or APP.  Future Appointments  Date Time Provider Department Center  10/21/2021  1:55 PM Madlyn Frankel [facility]  10/21/2021  3:00 PM [facility] NURSE [facility]  10/21/2021  3:15 PM [facility] US2 [facility]    [name], MD

## 2021-10-14 LAB — GC/CHLAMYDIA PROBE AMP (~~LOC~~) NOT AT ARMC
Chlamydia: NEGATIVE
Comment: NEGATIVE
Comment: NORMAL
Neisseria Gonorrhea: NEGATIVE

## 2021-10-16 ENCOUNTER — Encounter (HOSPITAL_COMMUNITY): Payer: Self-pay | Admitting: Obstetrics & Gynecology

## 2021-10-16 ENCOUNTER — Other Ambulatory Visit: Payer: Self-pay

## 2021-10-16 ENCOUNTER — Inpatient Hospital Stay (HOSPITAL_COMMUNITY)
Admission: AD | Admit: 2021-10-16 | Discharge: 2021-10-18 | DRG: 807 | Disposition: A | Discharge status: Home / Self Care | Payer: Medicaid Other | Attending: Family Medicine | Admitting: Family Medicine

## 2021-10-16 DIAGNOSIS — O326XX0 Maternal care for compound presentation, not applicable or unspecified: Secondary | ICD-10-CM | POA: Diagnosis not present

## 2021-10-16 DIAGNOSIS — Z20822 Contact with and (suspected) exposure to covid-19: Secondary | ICD-10-CM | POA: Diagnosis not present

## 2021-10-16 DIAGNOSIS — R87610 Atypical squamous cells of undetermined significance on cytologic smear of cervix (ASC-US): Secondary | ICD-10-CM | POA: Diagnosis present

## 2021-10-16 DIAGNOSIS — O43123 Velamentous insertion of umbilical cord, third trimester: Secondary | ICD-10-CM | POA: Diagnosis not present

## 2021-10-16 DIAGNOSIS — Z3046 Encounter for surveillance of implantable subdermal contraceptive: Secondary | ICD-10-CM

## 2021-10-16 DIAGNOSIS — O26893 Other specified pregnancy related conditions, third trimester: Secondary | ICD-10-CM | POA: Diagnosis not present

## 2021-10-16 DIAGNOSIS — O43199 Other malformation of placenta, unspecified trimester: Secondary | ICD-10-CM | POA: Diagnosis present

## 2021-10-16 DIAGNOSIS — Z3A37 37 weeks gestation of pregnancy: Secondary | ICD-10-CM | POA: Diagnosis not present

## 2021-10-16 DIAGNOSIS — Z8759 Personal history of other complications of pregnancy, childbirth and the puerperium: Secondary | ICD-10-CM

## 2021-10-16 DIAGNOSIS — O322XX0 Maternal care for transverse and oblique lie, not applicable or unspecified: Secondary | ICD-10-CM | POA: Diagnosis present

## 2021-10-16 DIAGNOSIS — D649 Anemia, unspecified: Secondary | ICD-10-CM | POA: Diagnosis not present

## 2021-10-16 DIAGNOSIS — Z30017 Encounter for initial prescription of implantable subdermal contraceptive: Secondary | ICD-10-CM | POA: Diagnosis not present

## 2021-10-16 DIAGNOSIS — O9902 Anemia complicating childbirth: Secondary | ICD-10-CM | POA: Diagnosis not present

## 2021-10-16 DIAGNOSIS — Z3480 Encounter for supervision of other normal pregnancy, unspecified trimester: Secondary | ICD-10-CM

## 2021-10-16 LAB — CBC
HCT: 33.7 % — ABNORMAL LOW (ref 36.0–46.0)
Hemoglobin: 10.5 g/dL — ABNORMAL LOW (ref 12.0–15.0)
MCH: 26.8 pg (ref 26.0–34.0)
MCHC: 31.2 g/dL (ref 30.0–36.0)
MCV: 86 fL (ref 80.0–100.0)
Platelets: 199 10*3/uL (ref 150–400)
RBC: 3.92 MIL/uL (ref 3.87–5.11)
RDW: 16.3 % — ABNORMAL HIGH (ref 11.5–15.5)
WBC: 7.7 10*3/uL (ref 4.0–10.5)
nRBC: 0.4 % — ABNORMAL HIGH (ref 0.0–0.2)

## 2021-10-16 LAB — TYPE AND SCREEN
ABO/RH(D): A POS
Antibody Screen: NEGATIVE

## 2021-10-16 LAB — RESP PANEL BY RT-PCR (FLU A&B, COVID) ARPGX2
Influenza A by PCR: NEGATIVE
Influenza B by PCR: NEGATIVE
SARS Coronavirus 2 by RT PCR: NEGATIVE

## 2021-10-16 LAB — RPR: RPR Ser Ql: NONREACTIVE

## 2021-10-16 LAB — POCT FERN TEST: POCT Fern Test: POSITIVE

## 2021-10-16 MED ORDER — TERBUTALINE SULFATE 1 MG/ML IJ SOLN: 0.2500 mg | Freq: Once | INTRAMUSCULAR | Status: DC | PRN

## 2021-10-16 MED ORDER — LACTATED RINGERS IV SOLN: INTRAVENOUS | Status: DC

## 2021-10-16 MED ORDER — FENTANYL CITRATE (PF) 100 MCG/2ML IJ SOLN
50.0000 ug | INTRAMUSCULAR | Status: DC | PRN
  Administered 2021-10-17: 50 ug via INTRAVENOUS
  Filled 2021-10-16: qty 2

## 2021-10-16 MED ORDER — OXYTOCIN BOLUS FROM INFUSION
333.0000 mL | Freq: Once | INTRAVENOUS | Status: AC
  Administered 2021-10-17: 333 mL via INTRAVENOUS

## 2021-10-16 MED ORDER — OXYCODONE-ACETAMINOPHEN 5-325 MG PO TABS: 2.0000 | ORAL_TABLET | ORAL | Status: DC | PRN

## 2021-10-16 MED ORDER — LACTATED RINGERS IV SOLN: 500.0000 mL | INTRAVENOUS | Status: DC | PRN

## 2021-10-16 MED ORDER — OXYTOCIN-SODIUM CHLORIDE 30-0.9 UT/500ML-% IV SOLN
1.0000 m[IU]/min | INTRAVENOUS | Status: DC
  Administered 2021-10-16: 8 m[IU]/min via INTRAVENOUS
  Administered 2021-10-16: 6 m[IU]/min via INTRAVENOUS
  Administered 2021-10-16: 2 m[IU]/min via INTRAVENOUS
  Administered 2021-10-16: 4 m[IU]/min via INTRAVENOUS

## 2021-10-16 MED ORDER — OXYTOCIN-SODIUM CHLORIDE 30-0.9 UT/500ML-% IV SOLN
2.5000 [IU]/h | INTRAVENOUS | Status: DC
  Filled 2021-10-16: qty 500

## 2021-10-16 MED ORDER — LIDOCAINE HCL (PF) 1 % IJ SOLN: 30.0000 mL | INTRAMUSCULAR | Status: DC | PRN

## 2021-10-16 MED ORDER — ONDANSETRON HCL 4 MG/2ML IJ SOLN: 4.0000 mg | Freq: Four times a day (QID) | INTRAMUSCULAR | Status: DC | PRN

## 2021-10-16 MED ORDER — ACETAMINOPHEN 325 MG PO TABS: 650.0000 mg | ORAL_TABLET | ORAL | Status: DC | PRN

## 2021-10-16 MED ORDER — SOD CITRATE-CITRIC ACID 500-334 MG/5ML PO SOLN: 30.0000 mL | ORAL | Status: DC | PRN

## 2021-10-16 MED ORDER — OXYCODONE-ACETAMINOPHEN 5-325 MG PO TABS: 1.0000 | ORAL_TABLET | ORAL | Status: DC | PRN

## 2021-10-16 NOTE — H&P (Addendum)
OBSTETRIC ADMISSION HISTORY AND PHYSICAL  Carla Haley is a 30 y.o. female [phone] with IUP at [redacted]w[redacted]d by LMP presenting for SOL. Down in MAU was fern negative x2, however continued to leak clear fluid and third one was +. Reports contractions every 5-6 min and are occasionally painful. She reports +FMs, no VB, no blurry vision, headaches or peripheral edema, and RUQ pain.  She plans on breast feeding. She request postpartum Nexplanon for birth control. She received her prenatal care at  [facility] . Prenatal records reviewed.  Dating: By LMP --->  Estimated Date of Delivery: 11/01/21  Sono:   @[redacted]w[redacted]d , CWD, normal anatomy, cephalic presentation, 3101g, EFW  Prenatal History/Complications:  -marginal cord insertion  Past Medical History: Past Medical History:  Diagnosis Date   Medical history non-contributory    Past Surgical History: Past Surgical History:  Procedure Laterality Date   NO PAST SURGERIES     Obstetrical History: OB History     Gravida  4   Para  3   Term  3   Preterm  0   AB  0   Living  3      SAB  0   IAB  0   Ectopic  0   Multiple  0   Live Births  3          Social History Social History   Socioeconomic History   Marital status: Married    Spouse name: Not on file   Number of children: Not on file   Years of education: Not on file   Highest education level: Not on file  Occupational History   Not on file  Tobacco Use   Smoking status: Never   Smokeless tobacco: Never  Vaping Use   Vaping Use: Never used  Substance and Sexual Activity   Alcohol use: No   Drug use: No   Sexual activity: Yes    Birth control/protection: None  Other Topics Concern   Not on file  Social History Narrative   Not on file   Social Determinants of Health   Financial Resource Strain: Not on file  Food Insecurity: No Food Insecurity   Worried About Running Out of Food in the Last Year: Never true   Ran Out of Food in the Last Year: Never true   Transportation Needs: No Transportation Needs   Lack of Transportation (Medical): No   Lack of Transportation (Non-Medical): No  Physical Activity: Not on file  Stress: Not on file  Social Connections: Not on file   Family History: Family History  Problem Relation Age of Onset   Hypertension Mother    Allergies: No Known Allergies  Medications Prior to Admission  Medication Sig Dispense Refill Last Dose   cyclobenzaprine (FLEXERIL) 10 MG tablet Take 1 tablet (10 mg total) by mouth every 8 (eight) hours as needed for muscle spasms. 30 tablet 1 10/15/2021   ferrous sulfate 325 (65 FE) MG tablet Take 1 tablet (325 mg total) by mouth every other day. 30 tablet 3 Past Week   omeprazole (PRILOSEC OTC) 20 MG tablet Take 1 tablet (20 mg total) by mouth daily. 90 tablet 1 10/15/2021   Prenatal Vit-Fe Fumarate-FA (PREPLUS) 27-1 MG TABS Take 1 tablet by mouth daily. 30 tablet 8 10/15/2021   pyridOXINE (VITAMIN B-6) 25 MG tablet Take 1 tablet (25 mg total) by mouth 4 (four) times daily as needed (nausea and vomiting). 30 tablet 3 10/15/2021   ondansetron (ZOFRAN-ODT) 4 MG disintegrating tablet  Take 1 tablet (4 mg total) by mouth every 8 (eight) hours as needed for nausea or vomiting. 15 tablet 0    Review of Systems  All systems reviewed and negative except as stated in HPI  Blood pressure 116/73, pulse (!) 106, temperature 98.3 F (36.8 C), temperature source Oral, resp. rate 17, last menstrual period 01/25/2021, SpO2 99 %, currently breastfeeding. General appearance: alert and cooperative Lungs: no dyspnea Heart: regular rate  Abdomen: soft, non-tender; gravid abdomen Extremities: Homans sign is negative, no sign of DVT Presentation: cephalic Fetal monitoringBaseline: 145 bpm, Variability: Good {> 6 bpm), Accelerations: Reactive, and Decelerations: Absent Uterine activityFrequency: Every 3-4 minutes Dilation: 3 Effacement (%): 60 Station: -2 Exam by:: Jeanice Lim Flippin RN  Prenatal  labs: ABO, Rh: A/Positive/-- (06/27 1522) Antibody: Negative (06/27 1522) Rubella: 13.00 (06/27 1522) RPR: Non Reactive (10/20 0915)  HBsAg: Negative (06/27 1522)  HIV: Non Reactive (10/20 0915)  GBS:   unknown, collected on 12/12 1 hr Glucola normal Genetic screening  normal Anatomy US normal  Prenatal Transfer Tool  Maternal Diabetes: No Genetic Screening: Normal Maternal Ultrasounds/Referrals: Normal Fetal Ultrasounds or other Referrals:  None Maternal Substance Abuse:  No Significant Maternal Medications:  None Significant Maternal Lab Results: Other:  GBS pending  Results for orders placed or performed during the hospital encounter of 10/16/21 (from the past 24 hour(s))  POCT fern test   Collection Time: 10/16/21 10:04 AM  Result Value Ref Range   POCT Fern Test Positive = ruptured amniotic membanes    Patient Active Problem List   Diagnosis Date Noted   Marginal insertion of umbilical cord affecting management of mother 06/07/2021   ASCUS of cervix with negative high risk HPV 05/01/2021   History of polyhydramnios 04/28/2021   Supervision of other normal pregnancy, antepartum 04/18/2021   Assessment/Plan:  Carla Haley is a 31 y.o. G4P3003 at [redacted]w[redacted]d here for SOL.   #Labor: early latent labor, contractions have spaced out. Will start pitocin #Pain: IV pain med vs epidural on request #FWB: Cat 1 #ID: GBS pending, collected 12/12 #MOF: Breast #MOC: Nexplanon #Circ: yes  #EFW 98% @ 34 wks 3101g, pelvis proven to 8lb 12oz  Trula Slade, MD PGY-2 10/16/2021, 10:15 AM   Attestation of Supervision of Student:  I confirm that I have verified the information documented in the medical students note and that I have also personally  supervised  the history, physical exam and all medical decision making activities.  I have verified that all services and findings are accurately documented in this student's note; and I agree with management and plan as outlined in the  documentation. I have also made any necessary editorial changes.  Bernerd Limbo, CNM [facility] [employer], [facility] 10/16/2021 12:51 PM

## 2021-10-16 NOTE — Progress Notes (Signed)
Patient ID: Carla Haley, female   DOB: [birth year], 30 y.o.   MRN: [id number] Doing well, starting to have pain with  contractions  Vitals:   10/16/21 1901 10/16/21 1945 10/16/21 2000 10/16/21 2030  BP: 109/73 127/86 118/70 111/68  Pulse: (!) 102 (!) 105 93 (!) 103  Resp:  18  18  Temp:  97.9 F (36.6 C)    TempSrc:  Oral    SpO2:      Weight:  69.7 kg    Height:  5\' 3"  (1.6 m)     FHR reassuring UCs q  Dilation: 4 Effacement (%): 80 Cervical Position: Posterior Station: -2 Presentation: Vertex Exam by:: Dr. [phone]  Continue to observe

## 2021-10-16 NOTE — MAU Provider Note (Addendum)
°  S: [name] is a 30 y.o. [phone] at [redacted]w[redacted]d  who presents to MAU today complaining of leaking of fluid since yesterday. She denies vaginal bleeding. She endorses contractions. She reports normal fetal movement.  O: BP 116/73 (BP Location: Right Arm)    Pulse (!) 106    Temp 98.3 F (36.8 C) (Oral)    Resp 17    LMP 01/25/2021    SpO2 99%  GENERAL: Well-developed, well-nourished female in no acute distress.  HEAD: Normocephalic, atraumatic.  CHEST: Normal effort of breathing, regular heart rate ABDOMEN: Soft, nontender, gravid PELVIC: Normal external female genitalia. Vagina is pink and rugated. Cervix with normal contour, no lesions. Normal discharge.  Present posterior pooling increases with coughing.  Cervical exam: 3cm     Fetal Monitoring: Baseline:  Variability: Moderate Accelerations:  Decelerations:  Contractions:   No results found for this or any previous visit (from the past 24 hour(s)).   A: SIUP at [redacted]w[redacted]d  SROM  P: Admit to L&D floor today. Has had uncomplicated pregnancy.  Marcelline Mates A, Medical Student 10/16/2021 10:01 AM   Attestation of Supervision of Student:  I confirm that I have verified the information documented in the medical students note and that I have also personally performed the history, physical exam and all medical decision making activities.  I have verified that all services and findings are accurately documented in this student's note; and I agree with management and plan as outlined in the documentation. I have also made any necessary editorial changes.  Fern positive  Dilation: 3 Effacement (%): 60 Cervical Position: Posterior Station: -2 Presentation: Vertex Exam by:: Holly Flippin RN   Fetal Monitoring: Baseline: 145 Variability: Moderate Accelerations: Present 10x10 and 15x15 Decelerations: None Contractions: Irritability, Occasional Mild Ctx   Cherre Robins, CNM [facility] [employer], [facility] 10/16/2021 10:40 AM

## 2021-10-16 NOTE — Progress Notes (Signed)
Labor Progress Note Carla Haley is a 30 y.o. G4P3003 at [redacted]w[redacted]d presented for SROM.  S: Patient feeling more pressure and pain with contractions. Walking around the room and position change for pain relief.    O:  BP 101/64    Pulse (!) 103    Temp 98.3 F (36.8 C) (Oral)    Resp 17    LMP 01/25/2021    SpO2 99%  EFM: 140/mod variability/+accels, no decels  CVE: Dilation: 4 Effacement (%): 70 Cervical Position: Posterior Station: -2 Presentation: Vertex Exam by:: Carla Box, RN  A&P: 30 y.o. [id number] [redacted]w[redacted]d SROM #Labor: Early latent labor. Progressing well. On 2U of pit.  #Pain: IV pain meds on request #FWB: cat 1 #GBS  pending  Carla Slade, MD PGY-2 3:12 PM

## 2021-10-16 NOTE — Progress Notes (Signed)
Labor Progress Note Carla Haley is a 30 y.o. G4P3003 at [redacted]w[redacted]d presented for SROM.  S: Patient feeling more pressure and pain with contractions.   O:  BP 121/75    Pulse 90    Temp 98.3 F (36.8 C) (Oral)    Resp 17    LMP 01/25/2021    SpO2 99%  EFM: 140/mod variability/+accels, no decels  CVE: Dilation: 4 Effacement (%): 80 Cervical Position: Posterior Station: -2 Presentation: Vertex Exam by:: Dr. Criss Rosales  A&P: 30 y.o. [id number] [redacted]w[redacted]d SROM #Labor: Early latent labor. No cervical change in the last 4h. Continue to increase pit, currently on 8.  #Pain: IV pain meds on request #FWB: cat 1 #GBS  pending  Trula Slade, MD PGY-2 6:57 PM

## 2021-10-16 NOTE — MAU Note (Addendum)
.  Carla Haley is a 30 y.o. at [redacted]w[redacted]d here in MAU reporting: ctx every 7 minutes. She states she has been having an increase in discharge since yesterday, unsure if it her water. Denies VB. Endorses good fetal movement.   Pain score: 5

## 2021-10-17 ENCOUNTER — Inpatient Hospital Stay (HOSPITAL_COMMUNITY): Payer: Medicaid Other | Admitting: Anesthesiology

## 2021-10-17 ENCOUNTER — Encounter (HOSPITAL_COMMUNITY): Payer: Self-pay | Admitting: Obstetrics and Gynecology

## 2021-10-17 DIAGNOSIS — O43123 Velamentous insertion of umbilical cord, third trimester: Secondary | ICD-10-CM

## 2021-10-17 DIAGNOSIS — O326XX0 Maternal care for compound presentation, not applicable or unspecified: Secondary | ICD-10-CM

## 2021-10-17 DIAGNOSIS — Z3A37 37 weeks gestation of pregnancy: Secondary | ICD-10-CM

## 2021-10-17 LAB — CULTURE, BETA STREP (GROUP B ONLY): Strep Gp B Culture: NEGATIVE

## 2021-10-17 MED ORDER — EPHEDRINE 5 MG/ML INJ: 10.0000 mg | INTRAVENOUS | Status: DC | PRN

## 2021-10-17 MED ORDER — FENTANYL-BUPIVACAINE-NACL 0.5-0.125-0.9 MG/250ML-% EP SOLN
12.0000 mL/h | EPIDURAL | Status: DC | PRN
  Filled 2021-10-17: qty 250

## 2021-10-17 MED ORDER — SENNOSIDES-DOCUSATE SODIUM 8.6-50 MG PO TABS
2.0000 | ORAL_TABLET | ORAL | Status: DC
  Administered 2021-10-18: 2 via ORAL
  Filled 2021-10-17 (×2): qty 2

## 2021-10-17 MED ORDER — DIPHENHYDRAMINE HCL 25 MG PO CAPS: 25.0000 mg | ORAL_CAPSULE | Freq: Four times a day (QID) | ORAL | Status: DC | PRN

## 2021-10-17 MED ORDER — TETANUS-DIPHTH-ACELL PERTUSSIS 5-2.5-18.5 LF-MCG/0.5 IM SUSY: 0.5000 mL | PREFILLED_SYRINGE | Freq: Once | INTRAMUSCULAR | Status: DC

## 2021-10-17 MED ORDER — PHENYLEPHRINE 40 MCG/ML (10ML) SYRINGE FOR IV PUSH (FOR BLOOD PRESSURE SUPPORT)
80.0000 ug | PREFILLED_SYRINGE | INTRAVENOUS | Status: DC | PRN
  Filled 2021-10-17: qty 10

## 2021-10-17 MED ORDER — ONDANSETRON HCL 4 MG PO TABS: 4.0000 mg | ORAL_TABLET | ORAL | Status: DC | PRN

## 2021-10-17 MED ORDER — LACTATED RINGERS IV SOLN: 500.0000 mL | Freq: Once | INTRAVENOUS | Status: DC

## 2021-10-17 MED ORDER — LIDOCAINE HCL (PF) 1 % IJ SOLN
INTRAMUSCULAR | Status: DC | PRN
  Administered 2021-10-17: 10 mL via EPIDURAL
  Administered 2021-10-17: 2 mL via EPIDURAL

## 2021-10-17 MED ORDER — BENZOCAINE-MENTHOL 20-0.5 % EX AERO: 1.0000 "application " | INHALATION_SPRAY | CUTANEOUS | Status: DC | PRN

## 2021-10-17 MED ORDER — WITCH HAZEL-GLYCERIN EX PADS: 1.0000 "application " | MEDICATED_PAD | CUTANEOUS | Status: DC | PRN

## 2021-10-17 MED ORDER — ACETAMINOPHEN 325 MG PO TABS
650.0000 mg | ORAL_TABLET | ORAL | Status: DC | PRN
  Administered 2021-10-17 – 2021-10-18 (×3): 650 mg via ORAL
  Filled 2021-10-17 (×3): qty 2

## 2021-10-17 MED ORDER — IBUPROFEN 600 MG PO TABS
600.0000 mg | ORAL_TABLET | Freq: Four times a day (QID) | ORAL | Status: DC
  Administered 2021-10-17 – 2021-10-18 (×6): 600 mg via ORAL
  Filled 2021-10-17 (×6): qty 1

## 2021-10-17 MED ORDER — DIBUCAINE (PERIANAL) 1 % EX OINT: 1.0000 "application " | TOPICAL_OINTMENT | CUTANEOUS | Status: DC | PRN

## 2021-10-17 MED ORDER — SIMETHICONE 80 MG PO CHEW: 80.0000 mg | CHEWABLE_TABLET | ORAL | Status: DC | PRN

## 2021-10-17 MED ORDER — ONDANSETRON HCL 4 MG/2ML IJ SOLN: 4.0000 mg | INTRAMUSCULAR | Status: DC | PRN

## 2021-10-17 MED ORDER — FENTANYL-BUPIVACAINE-NACL 0.5-0.125-0.9 MG/250ML-% EP SOLN
EPIDURAL | Status: DC | PRN
  Administered 2021-10-17: 12 mL/h via EPIDURAL

## 2021-10-17 MED ORDER — ZOLPIDEM TARTRATE 5 MG PO TABS: 5.0000 mg | ORAL_TABLET | Freq: Every evening | ORAL | Status: DC | PRN

## 2021-10-17 MED ORDER — PHENYLEPHRINE 40 MCG/ML (10ML) SYRINGE FOR IV PUSH (FOR BLOOD PRESSURE SUPPORT): 80.0000 ug | PREFILLED_SYRINGE | INTRAVENOUS | Status: DC | PRN

## 2021-10-17 MED ORDER — COCONUT OIL OIL
1.0000 "application " | TOPICAL_OIL | Status: DC | PRN
  Administered 2021-10-18: 1 via TOPICAL

## 2021-10-17 MED ORDER — PRENATAL MULTIVITAMIN CH
1.0000 | ORAL_TABLET | Freq: Every day | ORAL | Status: DC
  Administered 2021-10-17 – 2021-10-18 (×2): 1 via ORAL
  Filled 2021-10-17 (×2): qty 1

## 2021-10-17 MED ORDER — DIPHENHYDRAMINE HCL 50 MG/ML IJ SOLN: 12.5000 mg | INTRAMUSCULAR | Status: DC | PRN

## 2021-10-17 NOTE — Lactation Note (Signed)
This note was copied from a baby's chart. Lactation Consultation Note  Patient Name: Carla Haley QIONG'E Date: 10/17/2021 Reason for consult: Initial assessment;Early term 37-38.6wks Age:29 hours P4, experienced breastfeeding mom. Per mom, she feels breastfeeding is going well and infant recently breastfeed for 20 minutes prior to [facility] entering the  room. Mom doesn't have any breastfeeding questions for LC at this time.  Mom knows to breastfeeding infant according to feeding cues, 8 to 12 times within 24 hours, skin to skin. Mom knows to ask RN/LC if she has any breastfeeding questions, concerns or need assistance with latching infant at the breast.   Maternal Data Has patient been taught Hand Expression?: Yes Does the patient have breastfeeding experience prior to this delivery?: Yes How long did the patient breastfeed?: Per mom, BF 1st baby for 10 months, 2nd - 8 months, 3rd -6 months who is now 3 years.  Feeding Mother's Current Feeding Choice: Breast Milk  LATCH Score                    Lactation Tools Discussed/Used    Interventions    Discharge Pump: Personal  Consult Status Consult Status: Follow-up Date: 10/18/21 Follow-up type: In-patient    Danelle Earthly 10/17/2021, 7:26 PM

## 2021-10-17 NOTE — Progress Notes (Signed)
Patient ID: Carla Haley, female   DOB: 05/05/1991, 30 y.o.   MRN: [id number] Doing well  Vitals:   10/17/21 0551 10/17/21 0554 10/17/21 0556 10/17/21 0600  BP:  106/66  110/60  Pulse:  100  97  Resp:    18  Temp:      TempSrc:      SpO2: 98%  98% 98%  Weight:      Height:       FHR reassuring Audible?arrhythmia UCs q3-28min  RN instructed to go up on Pitocin (had hesitated due to audible arrhythmia)  Anticipate further progress as UCs increase

## 2021-10-17 NOTE — Anesthesia Preprocedure Evaluation (Signed)
Anesthesia Evaluation  Patient identified by MRN, date of birth, ID band Patient awake    Reviewed: Allergy & Precautions, Patient's Chart, lab work & pertinent test results  Airway Mallampati: II  TM Distance: >3 FB Neck ROM: Full    Dental no notable dental hx.    Pulmonary neg pulmonary ROS,    Pulmonary exam normal breath sounds clear to auscultation       Cardiovascular negative cardio ROS Normal cardiovascular exam Rhythm:Regular Rate:Normal     Neuro/Psych negative neurological ROS  negative psych ROS   GI/Hepatic Neg liver ROS, GERD  Medicated and Controlled,  Endo/Other  negative endocrine ROS  Renal/GU negative Renal ROS  negative genitourinary   Musculoskeletal negative musculoskeletal ROS (+)   Abdominal   Peds  Hematology  (+) Blood dyscrasia, anemia , hct 33.7, plt 199   Anesthesia Other Findings   Reproductive/Obstetrics (+) Pregnancy                             Anesthesia Physical Anesthesia Plan  ASA: 2  Anesthesia Plan: Epidural   Post-op Pain Management:    Induction:   PONV Risk Score and Plan: 2  Airway Management Planned: Natural Airway  Additional Equipment: None  Intra-op Plan:   Post-operative Plan:   Informed Consent: I have reviewed the patients History and Physical, chart, labs and discussed the procedure including the risks, benefits and alternatives for the proposed anesthesia with the patient or authorized representative who has indicated his/her understanding and acceptance.       Plan Discussed with:   Anesthesia Plan Comments:         Anesthesia Quick Evaluation

## 2021-10-17 NOTE — Discharge Summary (Signed)
Postpartum Discharge Summary    Patient Name: Carla Haley DOB: 03-09-1991 MRN: [id number]  Date of admission: 10/16/2021 Delivery date:10/17/2021  Delivering provider: Seabron Spates  Date of discharge: 10/18/2021  Admitting diagnosis: Indication for care in labor and delivery, antepartum [O75.9] Intrauterine pregnancy: [redacted]w[redacted]d    Secondary diagnosis:  Active Problems:   Supervision of other normal pregnancy, antepartum   History of polyhydramnios   ASCUS of cervix with negative high risk HPV   Marginal insertion of umbilical cord affecting management of mother  Additional problems: none    Discharge diagnosis: Term Pregnancy Delivered                                              Post partum procedures: Nexplanon placement Augmentation: Pitocin Complications: None  Hospital course: Onset of Labor With Vaginal Delivery      30y.o. yo [id number] admitted in Latent Labor on 10/16/2021. Patient had an uncomplicated labor course as follows:  Membrane Rupture Time/Date: 11:00 PM ,10/15/2021   Delivery Method:Vaginal, Spontaneous  Episiotomy: None  Lacerations:  None  Patient had an uncomplicated postpartum course.  She is ambulating, tolerating a regular diet, passing flatus, and urinating well. Patient is discharged home in stable condition on 10/18/21.  Newborn Data: Birth date:10/17/2021  Birth time:7:39 AM  Gender:Female  Living status:Living  Apgars:9 ,9  Weight:3840 g   Magnesium Sulfate received: No BMZ received: No Rhophylac:N/A MMR:N/A T-DaP:Given prenatally Flu: No Transfusion:No  Physical exam  Vitals:   10/17/21 1440 10/17/21 1815 10/17/21 2030 10/18/21 0530  BP: 110/63 108/72 111/68 100/71  Pulse: 82  89 91  Resp: _0 Temp: 98.6 F (37 C) 98 F (36.7 C) 98.3 F (36.8 C) 98.1 F (36.7 C)  TempSrc: Oral Oral Oral Oral  SpO2:   100% 100%  Weight:      Height:       General: alert Lochia: appropriate Uterine Fundus:  firm Incision: N/A DVT Evaluation: No evidence of DVT seen on physical exam. Labs: Lab Results  Component Value Date   WBC 7.7 10/16/2021   HGB 10.5 (L) 10/16/2021   HCT 33.7 (L) 10/16/2021   MCV 86.0 10/16/2021   PLT 199 10/16/2021   CMP Latest Ref Rng & Units 09/06/2017  Glucose 65 - 99 mg/dL 75  BUN 6 - 20 mg/dL 6  Creatinine 0.44 - 1.00 mg/dL 0.53  Sodium 135 - 145 mmol/L 132(L)  Potassium 3.5 - 5.1 mmol/L 3.9  Chloride 101 - 111 mmol/L 104  CO2 22 - 32 mmol/L 21(L)  Calcium 8.9 - 10.3 mg/dL 8.8(L)  Total Protein 6.5 - 8.1 g/dL 6.8  Total Bilirubin 0.3 - 1.2 mg/dL 0.5  Alkaline Phos 38 - 126 U/L 47  AST 15 - 41 U/L 14(L)  ALT 14 - 54 U/L 11(L)   Edinburgh Score: Edinburgh Postnatal Depression Scale Screening Tool 10/17/2021  I have been able to laugh and see the funny side of things. 0  I have looked forward with enjoyment to things. 0  I have blamed myself unnecessarily when things went wrong. 0  I have been anxious or worried for no good reason. 0  I have felt scared or panicky for no good reason. 0  Things have been getting on top of me. 1  I have been so unhappy that  I have had difficulty sleeping. 0  I have felt sad or miserable. 0  I have been so unhappy that I have been crying. 0  The thought of harming myself has occurred to me. 0  Edinburgh Postnatal Depression Scale Total 1      After visit meds:  Allergies as of 10/18/2021   No Known Allergies      Medication List     STOP taking these medications    cyclobenzaprine 10 MG tablet Commonly known as: FLEXERIL   ferrous sulfate 325 (65 FE) MG tablet   omeprazole 20 MG tablet Commonly known as: PriLOSEC OTC   ondansetron 4 MG disintegrating tablet Commonly known as: ZOFRAN-ODT   pyridOXINE 25 MG tablet Commonly known as: VITAMIN B-6       TAKE these medications    acetaminophen 325 MG tablet Commonly known as: Tylenol Take 2 tablets (650 mg total) by mouth every 4 (four) hours as  needed (for pain scale < 4).   ibuprofen 600 MG tablet Commonly known as: ADVIL Take 1 tablet (600 mg total) by mouth every 6 (six) hours.   PrePLUS 27-1 MG Tabs Take 1 tablet by mouth daily.        Please schedule this patient for Postpartum visit in: 4 weeks with the following provider: Any provider In-Person For C/S patients schedule nurse incision check in weeks 2 weeks: no Low risk pregnancy complicated by: marginal cord insertion Delivery mode:  SVD Anticipated Birth Control:  Nexplanon PP Procedures needed: none  Edinburgh: negative Schedule Integrated BH visit: no  No relevant baby issues   Discharge home in stable condition Infant Feeding: Breast Infant Disposition:home with mother Discharge instruction: per After Visit Summary and Postpartum booklet. Activity: Advance as tolerated. Pelvic rest for 6 weeks.  Diet: routine diet Anticipated Birth Control: Nexplanon Postpartum Appointment:4 weeks Additional Postpartum F/U:  None Future Appointments: Future Appointments  Date Time Provider Department Center  11/19/2021  8:15 AM Burleson, Rona Ravens, NP [facility]   Renard Matter, MD, MPH OB Fellow, Faculty Practice

## 2021-10-17 NOTE — Anesthesia Procedure Notes (Signed)
Epidural Patient location during procedure: OB Start time: 10/17/2021 2:24 AM End time: 10/17/2021 2:34 AM  Staffing Anesthesiologist: Lannie Fields, DO Performed: anesthesiologist   Preanesthetic Checklist Completed: patient identified, IV checked, risks and benefits discussed, monitors and equipment checked, pre-op evaluation and timeout performed  Epidural Patient position: sitting Prep: DuraPrep and site prepped and draped Patient monitoring: continuous pulse ox, blood pressure, heart rate and cardiac monitor Approach: midline Location: L3-L4 Injection technique: LOR air  Needle:  Needle type: Tuohy  Needle gauge: 17 G Needle length: 9 cm Needle insertion depth: 5 cm Catheter type: closed end flexible Catheter size: 19 Gauge Catheter at skin depth: 10 cm Test dose: negative  Assessment Sensory level: T8 Events: blood not aspirated, injection not painful, no injection resistance, no paresthesia and negative IV test  Additional Notes Patient identified. Risks/Benefits/Options discussed with patient including but not limited to bleeding, infection, nerve damage, paralysis, failed block, incomplete pain control, headache, blood pressure changes, nausea, vomiting, reactions to medication both or allergic, itching and postpartum back pain. Confirmed with bedside nurse the patient's most recent platelet count. Confirmed with patient that they are not currently taking any anticoagulation, have any bleeding history or any family history of bleeding disorders. Patient expressed understanding and wished to proceed. All questions were answered. Sterile technique was used throughout the entire procedure. Please see nursing notes for vital signs. Test dose was given through epidural catheter and negative prior to continuing to dose epidural or start infusion. Warning signs of high block given to the patient including shortness of breath, tingling/numbness in hands, complete motor  block, or any concerning symptoms with instructions to call for help. Patient was given instructions on fall risk and not to get out of bed. All questions and concerns addressed with instructions to call with any issues or inadequate analgesia.  Reason for block:procedure for pain

## 2021-10-17 NOTE — Anesthesia Postprocedure Evaluation (Signed)
Anesthesia Post Note  Patient: Carla Haley  Procedure(s) Performed: AN AD HOC LABOR EPIDURAL     Anesthesia Post Evaluation No notable events documented.  Last Vitals:  Vitals:   10/17/21 1055 10/17/21 1440  BP: 110/72 110/63  Pulse: 80 82  Resp: 18 18  Temp: 36.9 C 37 C  SpO2:      Last Pain:  Vitals:   10/17/21 1440  TempSrc: Oral  PainSc: 0-No pain   Pain Goal:                   Lucinda Dell

## 2021-10-18 DIAGNOSIS — Z30017 Encounter for initial prescription of implantable subdermal contraceptive: Secondary | ICD-10-CM

## 2021-10-18 MED ORDER — ETONOGESTREL 68 MG ~~LOC~~ IMPL
68.0000 mg | DRUG_IMPLANT | Freq: Once | SUBCUTANEOUS | Status: AC
  Administered 2021-10-18: 68 mg via SUBCUTANEOUS
  Filled 2021-10-18: qty 1

## 2021-10-18 MED ORDER — LIDOCAINE HCL 1 % IJ SOLN
0.0000 mL | Freq: Once | INTRAMUSCULAR | Status: AC | PRN
  Administered 2021-10-18: 1.5 mL via INTRADERMAL
  Filled 2021-10-18: qty 20

## 2021-10-18 MED ORDER — ACETAMINOPHEN 325 MG PO TABS
650.0000 mg | ORAL_TABLET | ORAL | 0 refills | Status: DC | PRN
Start: 2021-10-18 — End: 2022-01-20

## 2021-10-18 MED ORDER — IBUPROFEN 600 MG PO TABS: 600.0000 mg | ORAL_TABLET | Freq: Four times a day (QID) | ORAL | 0 refills | Status: DC

## 2021-10-18 NOTE — Procedures (Addendum)
° ° °  Nexplanon PROCEDURE NOTE  Carla Haley is a 30 y.o. [id number] being seen for inpatient PP Nexplanon insertion.    Nexplanon Insertion Procedure Patient identified, informed consent performed, consent signed.   Patient does understand that irregular bleeding is a very common side effect of this medication. She was advised to have backup contraception for one week after placement. Pregnancy test in clinic today was negative.  Appropriate time out taken.  Patient's left arm was prepped and draped in the usual sterile fashion. The ruler used to measure and mark insertion area.  Patient was prepped with alcohol swab and then injected with 1.5 ml of 1% lidocaine.  She was prepped with betadine, Nexplanon removed from packaging,  Device confirmed in needle, then inserted full length of needle and withdrawn per handbook instructions. Nexplanon was able to palpated in the patient's arm; patient palpated the insert herself. There was minimal blood loss.  Patient insertion site covered with guaze and a pressure bandage to reduce any bruising.  The patient tolerated the procedure well and was given post procedure instructions.    Lot no [id number] Exp: Aug 26 2023 SN [id number]  Warner Mccreedy, MD, MPH OB Fellow, Faculty Practice Center for [employer], [facility]

## 2021-10-20 ENCOUNTER — Telehealth: Payer: Self-pay

## 2021-10-20 ENCOUNTER — Encounter: Payer: Self-pay | Admitting: Radiology

## 2021-10-20 NOTE — Telephone Encounter (Signed)
Transition Care Management Follow-up Telephone Call Date of discharge and from where: 10/18/2021 from [facility] How have you been since you were released from the hospital? Pt stated that she is feeling better and has no questions or concerns at this time.  Any questions or concerns? No  Items Reviewed: Did the pt receive and understand the discharge instructions provided? Yes  Medications obtained and verified? Yes  Other? No  Any new allergies since your discharge? No  Dietary orders reviewed? No Do you have support at home? Yes   Functional Questionnaire: (I = Independent and D = Dependent) ADLs: I  Bathing/Dressing- I  Meal Prep- I  Eating- I  Maintaining continence- I  Transferring/Ambulation- I  Managing Meds- I   Follow up appointments reviewed:  PCP Hospital f/u appt confirmed? No  Specialist Hospital f/u appt confirmed? Yes  Scheduled to see Carla Dupes, NP on 11/19/2020 @ 8:15am. Are transportation arrangements needed? No  If their condition worsens, is the pt aware to call PCP or go to the Emergency Dept.? Yes Was the patient provided with contact information for the PCP's office or ED? Yes Was to pt encouraged to call back with questions or concerns? Yes

## 2021-10-21 ENCOUNTER — Ambulatory Visit: Payer: Medicaid Other

## 2021-10-21 ENCOUNTER — Encounter: Payer: Medicaid Other | Admitting: Student

## 2021-10-30 ENCOUNTER — Telehealth (HOSPITAL_COMMUNITY): Payer: Self-pay | Admitting: *Deleted

## 2021-10-30 NOTE — Telephone Encounter (Signed)
Mom reports feeling good. No concerns about herself at this time. EPDS=3(Hospital score=1) Mom reports baby is doing well. Feeding, peeing, and pooping without difficulty. Safe sleep reviewed. Mom reports no concerns about baby at present.  Duffy Rhody, RN 10-30-2021 at 11:43am

## 2021-11-18 ENCOUNTER — Other Ambulatory Visit: Payer: Self-pay

## 2021-11-18 ENCOUNTER — Encounter: Payer: Self-pay | Admitting: Obstetrics and Gynecology

## 2021-11-18 ENCOUNTER — Ambulatory Visit (INDEPENDENT_AMBULATORY_CARE_PROVIDER_SITE_OTHER): Payer: Medicaid Other | Admitting: Obstetrics and Gynecology

## 2021-11-18 VITALS — BP 116/67 | HR 77 | Wt 148.0 lb

## 2021-11-18 DIAGNOSIS — Z3046 Encounter for surveillance of implantable subdermal contraceptive: Secondary | ICD-10-CM | POA: Diagnosis not present

## 2021-11-18 NOTE — Progress Notes (Signed)
Post Partum Visit Note  Carla Haley is a 31 y.o. [phone] female who presents for a postpartum visit. She is 4 weeks postpartum following a normal spontaneous vaginal delivery.  I have fully reviewed the prenatal and intrapartum course. The delivery was at [redacted]w[redacted]d gestational weeks.  Anesthesia: epidural. Postpartum course has been uncomplicated. Baby is doing well. Baby is feeding by breast. Bleeding no bleeding. Bowel function is normal. Bladder function is normal. Patient is sexually active. Contraception method is Nexplanon however she is no longer able to feel it in her arm. Postpartum depression screening: negative.   Edinburgh Postnatal Depression Scale - 11/18/21 1554       Edinburgh Postnatal Depression Scale:  In the Past 7 Days   I have been able to laugh and see the funny side of things. 0    I have looked forward with enjoyment to things. 0    I have blamed myself unnecessarily when things went wrong. 0    I have been anxious or worried for no good reason. 0    I have felt scared or panicky for no good reason. 0    Things have been getting on top of me. 1    I have been so unhappy that I have had difficulty sleeping. 0    I have felt sad or miserable. 0    I have been so unhappy that I have been crying. 0    The thought of harming myself has occurred to me. 0    Edinburgh Postnatal Depression Scale Total 1             Health Maintenance Due  Topic Date Due   COVID-19 Vaccine (1) Never done   INFLUENZA VACCINE  06/02/2021    The following portions of the patient's history were reviewed and updated as appropriate: allergies, current medications, past family history, past medical history, past social history, past surgical history, and problem list.  Review of Systems Pertinent items are noted in HPI.  Objective:  BP 116/67    Pulse 77    Wt 148 lb (67.1 kg)    LMP 01/25/2021    Breastfeeding Yes    BMI 26.22 kg/m    General:  alert, cooperative, and no  distress   Breasts:  not indicated  Lungs: clear to auscultation bilaterally  Heart:  regular rate and rhythm  Abdomen: soft, non-tender; bowel sounds normal; no masses,  no organomegaly   Wound N/a  GU exam:  not indicated  Arm palpated by myself and Dr. Crissie Reese. Unable to palpate Nexplanon.    Assessment:    There are no diagnoses linked to this encounter.  4 wk postpartum exam.   Plan:   Essential components of care per ACOG recommendations:  1.  Mood and well being: Patient with negative depression screening today. Reviewed local resources for support.  - Patient tobacco use? No.   - hx of drug use? No.    2. Infant care and feeding:  -Patient currently breastmilk feeding? Yes. Reviewed importance of draining breast regularly to support lactation.  -Social determinants of health (SDOH) reviewed in EPIC. No concerns  3. Sexuality, contraception and birth spacing - Patient does not want a pregnancy in the next year.  Desired family size is 4 children she thinks.  - She has Nexplanon but not palpable. Will do xray of her arm.  - Discussed birth spacing of 18 months  4. Sleep and fatigue -Encouraged family/partner/community support of 4 hrs  of uninterrupted sleep to help with mood and fatigue  5. Physical Recovery  - Discussed patients delivery and complications. She describes her labor as mixed. - Patient had a Vaginal, no problems at delivery. Patient had  no  laceration. Perineal healing reviewed. Patient expressed understanding - Patient has urinary incontinence? No. - Patient is safe to resume physical and sexual activity  6.  Health Maintenance - HM due items addressed Yes - Last pap smear  Diagnosis  Date Value Ref Range Status  04/28/2021 (A)  Final   - Atypical squamous cells of undetermined significance (ASC-US)   Pap smear not done at today's visit. Pap was ASCUS/HPV negative 04/2021. She can return in 6 months for annual -Breast Cancer screening indicated?  No.   7. Chronic Disease/Pregnancy Condition follow up: None  - PCP follow up  [name], MD Center for [facility], [facility]

## 2021-11-19 ENCOUNTER — Ambulatory Visit: Payer: Medicaid Other | Admitting: Nurse Practitioner

## 2021-11-21 ENCOUNTER — Ambulatory Visit
Admission: RE | Admit: 2021-11-21 | Discharge: 2021-11-21 | Disposition: A | Discharge status: Home / Self Care | Payer: Medicaid Other | Source: Ambulatory Visit | Attending: Obstetrics and Gynecology | Admitting: Obstetrics and Gynecology

## 2021-11-21 ENCOUNTER — Other Ambulatory Visit: Payer: Self-pay

## 2021-11-21 DIAGNOSIS — Z3046 Encounter for surveillance of implantable subdermal contraceptive: Secondary | ICD-10-CM

## 2021-11-21 DIAGNOSIS — M7989 Other specified soft tissue disorders: Secondary | ICD-10-CM | POA: Diagnosis not present

## 2021-11-24 ENCOUNTER — Telehealth: Payer: Self-pay

## 2021-11-24 NOTE — Telephone Encounter (Signed)
Call placed to pt. Spoke with pt. Pt given results and recommendations per Dr Damita Dunnings. Pt verbalized understanding and agreeable to plan of care.  Pt transferred to front office for scheduling.  Colletta Maryland, RN

## 2021-11-24 NOTE — Telephone Encounter (Signed)
-----   Message from [name], MD sent at 11/24/2021  9:44 AM EST ----- Please let Markelle know her nexplanon is certainly in her arm but deeper than normal (13 mm deep) which explains why we cannot feel it. I would recommend removal in the OR but I would like to see her to discuss options for alternatives and to do a preop type appointment for expectations. It is not in an unsafe spot, so it is not an emergency but the sooner we remove it the easier it will be.   Thanks, pad

## 2021-12-23 NOTE — Progress Notes (Signed)
GYNECOLOGY OFFICE VISIT NOTE  History:   Carla Haley is a 31 y.o. [id number] here today for follow up for non-palpable nexplanon. I independently reviewed the Xray of her arm was done and it was in the correct position except for the depth - it is about 13 mm deep. The xray was done on 1/20.   She denies any abnormal vaginal discharge, bleeding, pelvic pain or other concerns.     Past Medical History:  Diagnosis Date   Medical history non-contributory     Past Surgical History:  Procedure Laterality Date   NO PAST SURGERIES      The following portions of the patient's history were reviewed and updated as appropriate: allergies, current medications, past family history, past medical history, past social history, past surgical history and problem list.   Health Maintenance:    Diagnosis  Date Value Ref Range Status  04/28/2021 (A)  Final   - Atypical squamous cells of undetermined significance (ASC-US)    Review of Systems:  Pertinent items noted in HPI and remainder of comprehensive ROS otherwise negative.  Physical Exam:  BP 110/70    Pulse 79    Wt 143 lb 9.6 oz (65.1 kg)    Breastfeeding Yes    BMI 25.44 kg/m  CONSTITUTIONAL: Well-developed, well-nourished female in no acute distress.  HEENT:  Normocephalic, atraumatic. External right and left ear normal. No scleral icterus.  NECK: Normal range of motion, supple, no masses noted on observation SKIN: No rash noted. Not diaphoretic. No erythema. No pallor. MUSCULOSKELETAL: Normal range of motion. No edema noted. Left arm palpated again without palpable nexlanon based on location on the xray NEUROLOGIC: Alert and oriented to person, place, and time. Normal muscle tone coordination. No cranial nerve deficit noted. PSYCHIATRIC: Normal mood and affect. Normal behavior. Normal judgment and thought content.  CARDIOVASCULAR: Normal heart rate noted RESPIRATORY: Effort and breath sounds normal, no problems with respiration  noted ABDOMEN: No masses noted. No other overt distention noted.    PELVIC: Deferred  Labs and Imaging Xray images reviewed independently by me. Images from 1/20. Nexplanon is in correct position but is 13 mm deep.  Assessment and Plan:   1. Birth control counseling - Reviewed different types of birth control available: OCPs, vaginal ring, Nexplanon, Depo, various types of IUDs, permanent sterilization.  We reviewed the advantages and risks of each (particularly risk of VTE with estrogen containing options). We discussed side effects of each. - Reviewed that birth control does not protect against STI. Condoms reduce the risk of transmission but are not 100% effective especially for HSV and HIV. - She would like to do a new Nexplanon at the time of the removal of this one that is too deep.    2. Encounter for surveillance of Nexplanon subdermal contraceptive - Discussed options for Nexplanon. May leave it in place for now but will need removal in OR with fluoro when the time comes for removal. At that time another form of birth control may be used. Additionally, she may have it removed now and do an alternative form of birth control. Discussed options as noted above. Risk to leaving it in now is it may migrate further although likely it would not.  - We discussed options and following discussion she would like to have this Nexplanon removed and new one placed.  - Message sent to [place] to schedule for removal under fluoro with insertion of new Nexplanon.    Routine preventative health maintenance measures emphasized.  She will need follow up for her pap in June 2023 as it was ASCUS/HPV negative.  Please refer to After Visit Summary for other counseling recommendations.   Return in about 5 months (around 05/23/2022) for annual.  [name], MD, FACOG Obstetrician & Gynecologist, [facility] for [facility], [facility]

## 2021-12-24 ENCOUNTER — Other Ambulatory Visit: Payer: Self-pay

## 2021-12-24 ENCOUNTER — Encounter: Payer: Self-pay | Admitting: Obstetrics and Gynecology

## 2021-12-24 ENCOUNTER — Ambulatory Visit (INDEPENDENT_AMBULATORY_CARE_PROVIDER_SITE_OTHER): Payer: Medicaid Other | Admitting: Obstetrics and Gynecology

## 2021-12-24 VITALS — BP 110/70 | HR 79 | Wt 143.6 lb

## 2021-12-24 DIAGNOSIS — Z3046 Encounter for surveillance of implantable subdermal contraceptive: Secondary | ICD-10-CM | POA: Diagnosis not present

## 2021-12-24 DIAGNOSIS — Z3009 Encounter for other general counseling and advice on contraception: Secondary | ICD-10-CM

## 2022-01-08 NOTE — H&P (Signed)
Faculty Practice Obstetrics and Gynecology Attending History and Physical ? ?Carla Haley is a 31 y.o. [id number] here today for follow up for non-palpable nexplanon. I independently reviewed the Xray of her arm was done and it was in the correct position except for the depth - it is about 13 mm deep. The xray was done on 1/20.  ?  ?She denies any abnormal vaginal discharge, bleeding, pelvic pain or other concerns. ? ?Past Medical History:  ?Diagnosis Date  ? Medical history non-contributory   ? ?Past Surgical History:  ?Procedure Laterality Date  ? NO PAST SURGERIES    ? ?OB History  ?Gravida Para Term Preterm AB Living  ?4 4 4  0 0 4  ?SAB IAB Ectopic Multiple Live Births  ?0 0 0 0 4  ?  ?# Outcome Date GA Lbr Len/2nd Weight Sex Delivery Anes PTL Lv  ?4 Term 10/17/21 [redacted]w[redacted]d 08:25 / 00:14 3840 g M Vag-Spont EPI  LIV  ?3 Term 04/10/18 [redacted]w[redacted]d   M Vag-Spont   LIV  ?2 Term 12/07/15 [redacted]w[redacted]d 07:03 / 00:07 3200 g F Vag-Spont EPI  LIV  ?1 Term 10/23/14 [redacted]w[redacted]d / 00:55 3840 g M Vag-Spont EPI  LIV  ?Patient denies any other pertinent gynecologic issues.  ?No current facility-administered medications on file prior to encounter.  ? ?Current Outpatient Medications on File Prior to Encounter  ?Medication Sig Dispense Refill  ? acetaminophen (TYLENOL) 325 MG tablet Take 2 tablets (650 mg total) by mouth every 4 (four) hours as needed (for pain scale < 4). (Patient not taking: Reported on 11/18/2021) 60 tablet 0  ? ibuprofen (ADVIL) 600 MG tablet Take 1 tablet (600 mg total) by mouth every 6 (six) hours. (Patient not taking: Reported on 11/18/2021) 30 tablet 0  ? Prenatal Vit-Fe Fumarate-FA (PREPLUS) 27-1 MG TABS Take 1 tablet by mouth daily. (Patient not taking: Reported on 11/18/2021) 30 tablet 8  ? ?No Known Allergies ? ?Social History:   reports that she has never smoked. She has never used smokeless tobacco. She reports that she does not drink alcohol and does not use drugs. ?Family History  ?Problem Relation Age of Onset  ?  Hypertension Mother   ? ? ?Review of Systems: Pertinent items noted in HPI and remainder of comprehensive ROS otherwise negative. ? ?PHYSICAL EXAM: ?BP 110/70   Pulse 79   Wt 143 lb 9.6 oz (65.1 kg)   Breastfeeding Yes   BMI 25.44 kg/m?  ?CONSTITUTIONAL: Well-developed, well-nourished female in no acute distress.  ?HENT:  Normocephalic, atraumatic, External right and left ear normal. Oropharynx is clear and moist ?EYES: Conjunctivae and EOM are normal. Pupils are equal, round, and reactive to light. No scleral icterus.  ?NECK: Normal range of motion, supple, no masses ?SKIN: Skin is warm and dry. No rash noted. Not diaphoretic. No erythema. No pallor. ?NEUROLOGIC: Alert and oriented to person, place, and time. Normal reflexes, muscle tone coordination. No cranial nerve deficit noted. ?PSYCHIATRIC: Normal mood and affect. Normal behavior. Normal judgment and thought content. ?CARDIOVASCULAR: Normal heart rate noted, regular rhythm ?RESPIRATORY: Effort and breath sounds normal, no problems with respiration noted ?ABDOMEN: Soft, nontender, nondistended. ?PELVIC: Deferred  ?MUSCULOSKELETAL: Normal range of motion. No tenderness.  No cyanosis, clubbing, or edema.  2+ distal pulses. ? ?Labs: ?No results found for this or any previous visit (from the past 336 hour(s)). ? ?Imaging Studies: ?No results found. ? ?Assessment: ?Active Problems: ?  Encounter for removal and reinsertion of Nexplanon ? ? ?Plan: ?- Discussed options for  Nexplanon. May leave it in place for now but will need removal in OR with fluoro when the time comes for removal. At that time another form of birth control may be used. Additionally, she may have it removed now and do an alternative form of birth control. Discussed all options for birth control. Risk to leaving it in now is it may migrate further although likely it would not.  ?- We discussed options and following discussion she would like to have this Nexplanon removed and new one placed.  ?-  Will remove Nexplanon under fluoroscopic guidance.  ? ? ?[name], MD, FACOG ?Obstetrician Heritage manager, Faculty Practice ?Center for [employer], [facility] ? ?

## 2022-01-14 ENCOUNTER — Encounter (HOSPITAL_BASED_OUTPATIENT_CLINIC_OR_DEPARTMENT_OTHER): Payer: Self-pay | Admitting: Obstetrics and Gynecology

## 2022-01-14 ENCOUNTER — Other Ambulatory Visit: Payer: Self-pay

## 2022-01-14 NOTE — Progress Notes (Signed)
Spoke w/ via phone for pre-op interview---Donnielle ?Lab needs dos----   UPT            ?Lab results------ ?COVID test -----patient states asymptomatic no test needed ?Arrive at -------1115 ?NPO after MN NO Solid Food.  Clear liquids from MN until---1015 ?Med rec completed ?Medications to take morning of surgery -----NONE ?Diabetic medication ----- ?Patient instructed no nail polish to be worn day of surgery ?Patient instructed to bring photo id and insurance card day of surgery ?Patient aware to have Driver (ride ) / caregiver Husband Alfonse Spruce    for 24 hours after surgery  ?Patient Special Instructions ----- ?Pre-Op special Istructions ----- ?Patient verbalized understanding of instructions that were given at this phone interview. ?Patient denies shortness of breath, chest pain, fever, cough at this phone interview.  ?

## 2022-01-20 ENCOUNTER — Other Ambulatory Visit: Payer: Self-pay

## 2022-01-20 ENCOUNTER — Ambulatory Visit (HOSPITAL_BASED_OUTPATIENT_CLINIC_OR_DEPARTMENT_OTHER): Payer: Medicaid Other | Admitting: Anesthesiology

## 2022-01-20 ENCOUNTER — Ambulatory Visit (HOSPITAL_COMMUNITY): Payer: Medicaid Other

## 2022-01-20 ENCOUNTER — Ambulatory Visit (HOSPITAL_BASED_OUTPATIENT_CLINIC_OR_DEPARTMENT_OTHER)
Admission: RE | Admit: 2022-01-20 | Discharge: 2022-01-20 | Disposition: A | Discharge status: Home / Self Care | Payer: Medicaid Other | Attending: Obstetrics and Gynecology | Admitting: Obstetrics and Gynecology

## 2022-01-20 ENCOUNTER — Encounter (HOSPITAL_BASED_OUTPATIENT_CLINIC_OR_DEPARTMENT_OTHER): Payer: Self-pay | Admitting: Obstetrics and Gynecology

## 2022-01-20 ENCOUNTER — Encounter (HOSPITAL_BASED_OUTPATIENT_CLINIC_OR_DEPARTMENT_OTHER)
Admission: RE | Disposition: A | Discharge status: Home / Self Care | Payer: Self-pay | Source: Home / Self Care | Attending: Obstetrics and Gynecology

## 2022-01-20 DIAGNOSIS — T85698A Other mechanical complication of other specified internal prosthetic devices, implants and grafts, initial encounter: Secondary | ICD-10-CM | POA: Diagnosis not present

## 2022-01-20 DIAGNOSIS — Z3046 Encounter for surveillance of implantable subdermal contraceptive: Secondary | ICD-10-CM | POA: Diagnosis not present

## 2022-01-20 DIAGNOSIS — T85628D Displacement of other specified internal prosthetic devices, implants and grafts, subsequent encounter: Secondary | ICD-10-CM

## 2022-01-20 DIAGNOSIS — Z308 Encounter for other contraceptive management: Secondary | ICD-10-CM | POA: Insufficient documentation

## 2022-01-20 HISTORY — PX: REMOVAL OF DRUG DELIVERY IMPLANT: SHX6585

## 2022-01-20 LAB — POCT PREGNANCY, URINE: Preg Test, Ur: NEGATIVE

## 2022-01-20 LAB — TYPE AND SCREEN
ABO/RH(D): A POS
Antibody Screen: NEGATIVE

## 2022-01-20 SURGERY — REMOVAL, DRUG DELIVERY IMPLANT
Anesthesia: General | Site: Uterus

## 2022-01-20 MED ORDER — ONDANSETRON HCL 4 MG/2ML IJ SOLN: 4.0000 mg | Freq: Once | INTRAMUSCULAR | Status: DC | PRN

## 2022-01-20 MED ORDER — OXYCODONE HCL 5 MG PO TABS: 5.0000 mg | ORAL_TABLET | Freq: Once | ORAL | Status: DC | PRN

## 2022-01-20 MED ORDER — MEPERIDINE HCL 25 MG/ML IJ SOLN: 6.2500 mg | INTRAMUSCULAR | Status: DC | PRN

## 2022-01-20 MED ORDER — BUPIVACAINE HCL (PF) 0.5 % IJ SOLN
INTRAMUSCULAR | Status: AC
  Filled 2022-01-20: qty 30

## 2022-01-20 MED ORDER — ACETAMINOPHEN 500 MG PO TABS
ORAL_TABLET | ORAL | Status: AC
  Filled 2022-01-20: qty 2

## 2022-01-20 MED ORDER — ACETAMINOPHEN 500 MG PO TABS
1000.0000 mg | ORAL_TABLET | Freq: Once | ORAL | Status: AC
  Administered 2022-01-20: 1000 mg via ORAL

## 2022-01-20 MED ORDER — BUPIVACAINE HCL 0.5 % IJ SOLN
INTRAMUSCULAR | Status: DC | PRN
  Administered 2022-01-20: 10 mL

## 2022-01-20 MED ORDER — KETOROLAC TROMETHAMINE 30 MG/ML IJ SOLN
INTRAMUSCULAR | Status: DC | PRN
  Administered 2022-01-20: 30 mg via INTRAVENOUS

## 2022-01-20 MED ORDER — PROPOFOL 10 MG/ML IV BOLUS
INTRAVENOUS | Status: DC | PRN
  Administered 2022-01-20: 200 mg via INTRAVENOUS

## 2022-01-20 MED ORDER — FENTANYL CITRATE (PF) 100 MCG/2ML IJ SOLN
INTRAMUSCULAR | Status: DC | PRN
Start: 2022-01-20 — End: 2022-01-20
  Administered 2022-01-20 (×2): 25 ug via INTRAVENOUS

## 2022-01-20 MED ORDER — MIDAZOLAM HCL 2 MG/2ML IJ SOLN
INTRAMUSCULAR | Status: AC
  Filled 2022-01-20: qty 2

## 2022-01-20 MED ORDER — ETONOGESTREL 68 MG ~~LOC~~ IMPL
68.0000 mg | DRUG_IMPLANT | Freq: Once | SUBCUTANEOUS | Status: DC
  Filled 2022-01-20: qty 1

## 2022-01-20 MED ORDER — POVIDONE-IODINE 10 % EX SWAB: 2.0000 "application " | Freq: Once | CUTANEOUS | Status: DC

## 2022-01-20 MED ORDER — IBUPROFEN 800 MG PO TABS
800.0000 mg | ORAL_TABLET | Freq: Three times a day (TID) | ORAL | 1 refills | Status: AC | PRN
Start: 2022-01-20 — End: ?

## 2022-01-20 MED ORDER — KETOROLAC TROMETHAMINE 30 MG/ML IJ SOLN: 30.0000 mg | Freq: Once | INTRAMUSCULAR | Status: DC | PRN

## 2022-01-20 MED ORDER — FENTANYL CITRATE (PF) 100 MCG/2ML IJ SOLN
INTRAMUSCULAR | Status: AC
  Filled 2022-01-20: qty 2

## 2022-01-20 MED ORDER — DEXAMETHASONE SODIUM PHOSPHATE 4 MG/ML IJ SOLN
INTRAMUSCULAR | Status: DC | PRN
  Administered 2022-01-20: 8 mg via INTRAVENOUS

## 2022-01-20 MED ORDER — SCOPOLAMINE 1 MG/3DAYS TD PT72
MEDICATED_PATCH | TRANSDERMAL | Status: AC
  Filled 2022-01-20: qty 1

## 2022-01-20 MED ORDER — AMISULPRIDE (ANTIEMETIC) 5 MG/2ML IV SOLN: 10.0000 mg | Freq: Once | INTRAVENOUS | Status: DC | PRN

## 2022-01-20 MED ORDER — LIDOCAINE HCL (CARDIAC) PF 100 MG/5ML IV SOSY
PREFILLED_SYRINGE | INTRAVENOUS | Status: DC | PRN
  Administered 2022-01-20: 40 mg via INTRAVENOUS

## 2022-01-20 MED ORDER — MIDAZOLAM HCL 2 MG/2ML IJ SOLN
INTRAMUSCULAR | Status: DC | PRN
  Administered 2022-01-20: 2 mg via INTRAVENOUS

## 2022-01-20 MED ORDER — OXYCODONE HCL 5 MG/5ML PO SOLN: 5.0000 mg | Freq: Once | ORAL | Status: DC | PRN

## 2022-01-20 MED ORDER — HYDROMORPHONE HCL 1 MG/ML IJ SOLN: 0.2500 mg | INTRAMUSCULAR | Status: DC | PRN

## 2022-01-20 MED ORDER — ONDANSETRON HCL 4 MG/2ML IJ SOLN
INTRAMUSCULAR | Status: DC | PRN
  Administered 2022-01-20: 4 mg via INTRAVENOUS

## 2022-01-20 MED ORDER — SCOPOLAMINE 1 MG/3DAYS TD PT72
1.0000 | MEDICATED_PATCH | TRANSDERMAL | Status: DC
  Administered 2022-01-20: 1.5 mg via TRANSDERMAL

## 2022-01-20 MED ORDER — OXYCODONE HCL 5 MG PO TABS: 5.0000 mg | ORAL_TABLET | ORAL | 0 refills | Status: AC | PRN

## 2022-01-20 MED ORDER — LACTATED RINGERS IV SOLN: INTRAVENOUS | Status: DC

## 2022-01-20 SURGICAL SUPPLY — 21 items
ADH SKN CLS APL DERMABOND .7 (GAUZE/BANDAGES/DRESSINGS) ×1
BLADE SURG SZ11 CARB STEEL (BLADE) ×1 IMPLANT
BNDG CMPR 5X3 CHSV STRCH STRL (GAUZE/BANDAGES/DRESSINGS) ×1
BNDG COHESIVE 3X5 TAN ST LF (GAUZE/BANDAGES/DRESSINGS) ×1 IMPLANT
COVER BACK TABLE 60X90IN (DRAPES) ×2 IMPLANT
DERMABOND ADVANCED (GAUZE/BANDAGES/DRESSINGS) ×1
DERMABOND ADVANCED .7 DNX12 (GAUZE/BANDAGES/DRESSINGS) IMPLANT
DRSG COVADERM PLUS 2X2 (GAUZE/BANDAGES/DRESSINGS) ×1 IMPLANT
DURAPREP 26ML APPLICATOR (WOUND CARE) ×1 IMPLANT
GAUZE 4X4 16PLY ~~LOC~~+RFID DBL (SPONGE) ×1 IMPLANT
GLOVE SURG ENC MOIS LTX SZ6 (GLOVE) ×1 IMPLANT
GLOVE SURG UNDER LTX SZ6.5 (GLOVE) ×1 IMPLANT
GOWN STRL REUS W/ TWL LRG LVL3 (GOWN DISPOSABLE) ×1 IMPLANT
GOWN STRL REUS W/TWL LRG LVL3 (GOWN DISPOSABLE) ×2
NS IRRIG 1000ML POUR BTL (IV SOLUTION) ×2 IMPLANT
Nexplanon ×1 IMPLANT
PAD OB MATERNITY 4.3X12.25 (PERSONAL CARE ITEMS) ×1 IMPLANT
SLEEVE SCD COMPRESS KNEE MED (STOCKING) ×3 IMPLANT
SUT VICRYL 4-0 PS2 18IN ABS (SUTURE) ×2 IMPLANT
TOWEL OR 17X26 10 PK STRL BLUE (TOWEL DISPOSABLE) ×3 IMPLANT
TRAY DSU PREP LF (CUSTOM PROCEDURE TRAY) IMPLANT

## 2022-01-20 NOTE — Op Note (Signed)
Carla Haley ?PROCEDURE DATE: 01/20/2022 ? ? ?PREOPERATIVE DIAGNOSIS:  Non-palpable Nexplanon - migration vs misplaced ? ?POSTOPERATIVE DIAGNOSIS:  Non-palpable Nexplanon - migration vs misplaced ? ?PROCEDURE:  Removal of Nexplanon under fluoroscopic guidance.  ? ?SURGEON:  Dr. [name] ? ?ASSISTANT:  None ? ?ANESTHESIA:  LMA ? ?COMPLICATIONS:  None immediate. ? ?ESTIMATED BLOOD LOSS:  10 ml. ? ?FLUIDS: 500 ml LR. ? ?URINE OUTPUT:  Not drained ? ?Fluoro time: 5s of fluoro with pt shielded; 0.19mG y ? ?INDICATIONS: 31 y.o. [id number] with non-palpable Nexplanon confirmed placed or migrated to 13 mm depth in her arm but otherwise in correct location. She was offered removal now vs in 3 years and desired removal now.  ?   ?FINDINGS:  Nexplanon was about 13 mm deep as expected by xray.  ? ?TECHNIQUE:  The patient was taken to the operating room where LMA was obtained without difficulty.  An adequate timeout was performed. Her left arm was prepared with chloraprep and draped in the usual fashion.  ? ?Nexplanon site identified in left arm and clamp placed near expected site of Nexplanon. Once at the tip of the Nexplanon by fluoro imaging, her skin was marked. A 1cm skin incision was made with the scalpel. Small retractors used to see into the incision. I then with a curved hemostat could feel the Nexplanon at the depth expected by xray. I then grasped what I felt was the Nexplanon which was confirmed on fluoro once again. I was then able to remove it without difficulty.  There was minimal blood loss.  Area was then injected with 10 ml of 0.5% marcaine. Nexplanon removed from packaging, Device confirmed in needle, then inserted full length of needle and withdrawn per handbook instructions. Nexplanon was able to palpated in the patient's arm.  There was minimal blood loss. Skin incision was closed in subcuticular manner with 4-0 vicryl. Dermabond placed over the incision. Once dry, insertion site covered with gauze and a  pressure bandage to reduce any bruising. Counts correct x 2. Pt taken to recovery room in good condition.  ? ?The patient will be discharged to home as per PACU criteria.  Routine postoperative instructions given.  She will follow up in the office as needed.  ? ?[name], MD ?Attending Obstetrician & Gynecologist, Faculty Practice ?Center for [employer], [facility] ? ? ?

## 2022-01-20 NOTE — Interval H&P Note (Signed)
History and Physical Interval Note: ? ?01/20/2022 ?12:45 PM ? ?Carla Haley  has presented today for surgery, with the diagnosis of Non-Palpable Nexplanon.  The various methods of treatment have been discussed with the patient and family. After consideration of risks, benefits and other options for treatment, the patient has consented to  Procedure(s): ?REMOVAL and REINSERTION OF DRUG DELIVERY IMPLANT (N/A) as a surgical intervention.  The patient's history has been reviewed, patient examined, no change in status, stable for surgery.  I have reviewed the patient's chart and labs.  Questions were answered to the patient's satisfaction.   ? ? ?Carla Haley ? ? ?

## 2022-01-20 NOTE — Anesthesia Postprocedure Evaluation (Signed)
Anesthesia Post Note ? ?Patient: Carla Haley ? ?Procedure(s) Performed: REMOVAL and REINSERTION OF DRUG DELIVERY IMPLANT (Uterus) ? ?  ? ?Patient location during evaluation: PACU ?Anesthesia Type: General ?Level of consciousness: awake and alert, oriented and patient cooperative ?Pain management: pain level controlled ?Vital Signs Assessment: post-procedure vital signs reviewed and stable ?Respiratory status: spontaneous breathing, nonlabored ventilation and respiratory function stable ?Cardiovascular status: blood pressure returned to baseline and stable ?Postop Assessment: no apparent nausea or vomiting ?Anesthetic complications: no ? ? ?No notable events documented. ? ?Last Vitals:  ?Vitals:  ? 01/20/22 1415 01/20/22 1430  ?BP: 135/69 132/68  ?Pulse: (!) 52 (!) 52  ?Resp: 14 (!) 23  ?Temp:    ?SpO2: 98% 98%  ?  ?Last Pain:  ?Vitals:  ? 01/20/22 1357  ?TempSrc:   ?PainSc: 0-No pain  ? ? ?  ?  ?  ?  ?  ?  ? ?Tennis Must Shayla Heming ? ? ? ? ?

## 2022-01-20 NOTE — Anesthesia Procedure Notes (Signed)
Procedure Name: LMA Insertion ?Date/Time: 01/20/2022 1:15 PM ?Performed by: Earmon Phoenix, CRNA ?Pre-anesthesia Checklist: Patient identified, Emergency Drugs available, Suction available, Patient being monitored and Timeout performed ?Patient Re-evaluated:Patient Re-evaluated prior to induction ?Oxygen Delivery Method: Circle system utilized ?Preoxygenation: Pre-oxygenation with 100% oxygen ?Induction Type: IV induction ?Ventilation: Mask ventilation without difficulty ?LMA: LMA inserted ?LMA Size: 3.0 ?Number of attempts: 1 ?Placement Confirmation: positive ETCO2, CO2 detector and breath sounds checked- equal and bilateral ?Tube secured with: Tape ?Dental Injury: Teeth and Oropharynx as per pre-operative assessment  ? ? ? ? ?

## 2022-01-20 NOTE — Discharge Instructions (Addendum)
?  Post Anesthesia Home Care Instructions ? ?Activity: ?Get plenty of rest for the remainder of the day. A responsible individual must stay with you for 24 hours following the procedure.  ?For the next 24 hours, DO NOT: ?-Drive a car ?-Advertising copywriter ?-Drink alcoholic beverages ?-Take any medication unless instructed by your physician ?-Make any legal decisions or sign important papers. ? ?Meals: ?Start with liquid foods such as gelatin or soup. Progress to regular foods as tolerated. Avoid greasy, spicy, heavy foods. If nausea and/or vomiting occur, drink only clear liquids until the nausea and/or vomiting subsides. Call your physician if vomiting continues. ? ?Special Instructions/Symptoms: ?Your throat may feel dry or sore from the anesthesia or the breathing tube placed in your throat during surgery. If this causes discomfort, gargle with warm salt water. The discomfort should disappear within 24 hours. ? ?If you had a scopolamine patch placed behind your ear for the management of post- operative nausea and/or vomiting: ? ?1. The medication in the patch is effective for 72 hours, after which it should be removed.  Wrap patch in a tissue and discard in the trash. Wash hands thoroughly with soap and water. ?2. You may remove the patch earlier than 72 hours if you experience unpleasant side effects which may include dry mouth, dizziness or visual disturbances. ?3. Avoid touching the patch. Wash your hands with soap and water after contact with the patch. ? ? ?Remove patch behind left ear by Friday, January 23, 2022. ?May take Tylenol beginning at 6:30 PM as needed for pain. ?Call your surgeon if you experience:  ? ?1.  Fever over 101.0. ?2.  Inability to urinate. ?3.  Nausea and/or vomiting. ?4.  Extreme swelling or bruising at the surgical site. ?5.  Continued bleeding from the incision. ?6.  Increased pain, redness or drainage from the incision. ?7.  Problems related to your pain medication. ?8.  Any problems  and/or concerns. ? ?Call your surgeon if you experience:  ? ?1.  Fever over 101.0. ?2.  Inability to urinate. ?3.  Nausea and/or vomiting. ?4.  Extreme swelling or bruising at the surgical site. ?5.  Continued bleeding from the incision. ?6.  Increased pain, redness or drainage from the incision. ?7.  Problems related to your pain medication. ?8.  Any problems and/or concerns  ? ? ?

## 2022-01-20 NOTE — Transfer of Care (Signed)
Immediate Anesthesia Transfer of Care Note ? ?Patient: Carla Haley ? ?Procedure(s) Performed: REMOVAL and REINSERTION OF DRUG DELIVERY IMPLANT (Uterus) ? ?Patient Location: PACU ? ?Anesthesia Type:General ? ?Level of Consciousness: awake, alert , oriented and patient cooperative ? ?Airway & Oxygen Therapy: Patient Spontanous Breathing ? ?Post-op Assessment: Report given to RN and Post -op Vital signs reviewed and stable ? ?Post vital signs: Reviewed and stable ? ?Last Vitals:  ?Vitals Value Taken Time  ?BP 151/76 01/20/22 1355  ?Temp    ?Pulse    ?Resp 22 01/20/22 1356  ?SpO2    ?Vitals shown include unvalidated device data. ? ?Last Pain:  ?Vitals:  ? 01/20/22 1216  ?TempSrc: Oral  ?PainSc: 0-No pain  ?   ? ?Patients Stated Pain Goal: 5 (01/20/22 1216) ? ?Complications: No notable events documented. ?

## 2022-01-20 NOTE — Anesthesia Preprocedure Evaluation (Addendum)
Anesthesia Evaluation  ?Patient identified by MRN, date of birth, ID band ?Patient awake ? ? ? ?Reviewed: ?Allergy & Precautions, NPO status , Patient's Chart, lab work & pertinent test results ? ?Airway ?Mallampati: I ? ?TM Distance: >3 FB ?Neck ROM: Full ? ? ? Dental ? ?(+) Teeth Intact, Dental Advisory Given ?  ?Pulmonary ?neg pulmonary ROS,  ?  ?Pulmonary exam normal ?breath sounds clear to auscultation ? ? ? ? ? ? Cardiovascular ?negative cardio ROS ?Normal cardiovascular exam ?Rhythm:Regular Rate:Normal ? ? ?  ?Neuro/Psych ?negative neurological ROS ? negative psych ROS  ? GI/Hepatic ?negative GI ROS, Neg liver ROS,   ?Endo/Other  ?negative endocrine ROS ? Renal/GU ?negative Renal ROS  ?negative genitourinary ?  ?Musculoskeletal ?negative musculoskeletal ROS ?(+)  ? Abdominal ?  ?Peds ? Hematology ?negative hematology ROS ?(+)   ?Anesthesia Other Findings ? ? Reproductive/Obstetrics ?negative OB ROS ? ?  ? ? ? ? ? ? ? ? ? ? ? ? ? ?  ?  ? ? ? ? ? ? ? ?Anesthesia Physical ?Anesthesia Plan ? ?ASA: 1 ? ?Anesthesia Plan: General  ? ?Post-op Pain Management: Toradol IV (intra-op)* and Tylenol PO (pre-op)*  ? ?Induction: Intravenous ? ?PONV Risk Score and Plan: 3 and Ondansetron, Dexamethasone, Midazolam, Treatment may vary due to age or medical condition and Scopolamine patch - Pre-op ? ?Airway Management Planned: LMA ? ?Additional Equipment: None ? ?Intra-op Plan:  ? ?Post-operative Plan: Extubation in OR ? ?Informed Consent: I have reviewed the patients History and Physical, chart, labs and discussed the procedure including the risks, benefits and alternatives for the proposed anesthesia with the patient or authorized representative who has indicated his/her understanding and acceptance.  ? ? ? ?Dental advisory given ? ?Plan Discussed with: CRNA ? ?Anesthesia Plan Comments:   ? ? ? ? ? ?Anesthesia Quick Evaluation ? ?

## 2022-01-21 ENCOUNTER — Encounter (HOSPITAL_BASED_OUTPATIENT_CLINIC_OR_DEPARTMENT_OTHER): Payer: Self-pay | Admitting: Obstetrics and Gynecology
